# Patient Record
Sex: Female | Born: 1980 | Hispanic: No | Marital: Married | Smoking: Never smoker
Health system: Southern US, Community
[De-identification: ages and names within clinical notes are randomized; demographics above are authoritative.]

## PROBLEM LIST (undated history)

## (undated) DIAGNOSIS — R112 Nausea with vomiting, unspecified: Secondary | ICD-10-CM

## (undated) DIAGNOSIS — Z8619 Personal history of other infectious and parasitic diseases: Secondary | ICD-10-CM

## (undated) DIAGNOSIS — D500 Iron deficiency anemia secondary to blood loss (chronic): Secondary | ICD-10-CM

## (undated) DIAGNOSIS — N920 Excessive and frequent menstruation with regular cycle: Secondary | ICD-10-CM

## (undated) DIAGNOSIS — R42 Dizziness and giddiness: Secondary | ICD-10-CM

---
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
## 2018-09-07 LAB — GLUCOSE, POCT (MANUAL RESULT ENTRY): POC Glucose: 104 mg/dl — AB (ref 70–99)

## 2018-10-31 ENCOUNTER — Ambulatory Visit: Payer: Self-pay | Admitting: Internal Medicine

## 2018-11-01 ENCOUNTER — Ambulatory Visit: Payer: Self-pay | Admitting: Internal Medicine

## 2018-12-06 ENCOUNTER — Ambulatory Visit: Payer: Self-pay | Admitting: Internal Medicine

## 2018-12-19 ENCOUNTER — Ambulatory Visit: Payer: Self-pay | Admitting: Internal Medicine

## 2019-02-05 ENCOUNTER — Ambulatory Visit: Payer: Self-pay | Admitting: Internal Medicine

## 2019-03-16 ENCOUNTER — Ambulatory Visit: Payer: Self-pay | Admitting: Internal Medicine

## 2019-03-20 ENCOUNTER — Other Ambulatory Visit: Payer: Self-pay

## 2019-03-20 ENCOUNTER — Ambulatory Visit: Payer: Self-pay | Admitting: Internal Medicine

## 2019-03-20 ENCOUNTER — Encounter: Payer: Self-pay | Admitting: Internal Medicine

## 2019-03-20 VITALS — BP 112/70 | HR 90 | Resp 12 | Ht 63.0 in | Wt 183.0 lb

## 2019-03-20 DIAGNOSIS — R519 Headache, unspecified: Secondary | ICD-10-CM

## 2019-03-20 DIAGNOSIS — E669 Obesity, unspecified: Secondary | ICD-10-CM

## 2019-03-20 DIAGNOSIS — N920 Excessive and frequent menstruation with regular cycle: Secondary | ICD-10-CM

## 2019-03-20 DIAGNOSIS — K029 Dental caries, unspecified: Secondary | ICD-10-CM | POA: Insufficient documentation

## 2019-03-20 DIAGNOSIS — R42 Dizziness and giddiness: Secondary | ICD-10-CM

## 2019-03-20 DIAGNOSIS — Z6832 Body mass index (BMI) 32.0-32.9, adult: Secondary | ICD-10-CM

## 2019-03-20 DIAGNOSIS — R51 Headache: Secondary | ICD-10-CM

## 2019-03-20 DIAGNOSIS — Z13220 Encounter for screening for lipoid disorders: Secondary | ICD-10-CM

## 2019-03-20 HISTORY — DX: Dizziness and giddiness: R42

## 2019-03-20 HISTORY — DX: Excessive and frequent menstruation with regular cycle: N92.0

## 2019-03-20 HISTORY — DX: Dental caries, unspecified: K02.9

## 2019-03-20 HISTORY — DX: Headache, unspecified: R51.9

## 2019-03-20 MED ORDER — FERROUS GLUCONATE 324 (38 FE) MG PO TABS: 324.0000 mg | ORAL_TABLET | Freq: Two times a day (BID) | ORAL | 3 refills | Status: DC

## 2019-03-20 NOTE — Progress Notes (Signed)
    Subjective:    Patient ID: Stacie Carney, female   DOB: 1981/05/16, 38 y.o.   MRN: [id number]   HPI   Here to establish [facility] interpreting on language line.    1.  Problems patient relates to copper IUD:  Complains of frontal headaches and dizziness as well as heavy periods since IUD placed.   States did not have these symptoms as significantly prior to placement.  Has them all the time now.   Periods are regular, but now 7 days and heavy.  Previously only 3 days and light.  When she is having periods is when she really feels dizzy and with headaches. She is not sure if she wants to have more children or not.   Sounds like her husband feels it's fine for her to just have grandchildren someday. Sounds like she has used injections and BCPs in past.  2.  Dental and gum pain and disease.  Would like a dental exam.  No outpatient medications have been marked as taking for the 03/20/19 encounter (Office Visit) with Mack Hook, MD.   No Known Allergies   Review of Systems    Objective:   BP 112/70 (BP Location: Left Arm, Patient Position: Sitting, Cuff Size: Normal)   Pulse 90   Resp 12   Ht '5\' 3"'$  (1.6 m)   Wt 183 lb (83 kg)   LMP 03/18/2019   BMI 32.42 kg/m   Physical Exam  NAD Obese HEENT: PERRL, EOMI, palpabral conjunctivae quit pale.  TMs pearly gray.  Throat without injection. Missing and broken teeth with gingival recession. Neck:  Supple No adenopathy Chest:  CTA CV:  RRR with normal S1 and S2, No S3, S4 or murmur.  Radial and DP pulses normal and equal Abd:  S NT, No HSM or mass, + BS LE:  No edema. Hyperpigmented marks on dorsal index fingers from sucking those areas as child.  Assessment & Plan   1.  Menorrhagia since IUD placed:  Discussed options, including permanent with Tubal ligation, husband undergoing vasectomy.  BCPs, injection, implant, condoms and being fitted for diaphragm. She and her husband will let me know, though she seems  knowledgeable as to what can be done at [facility].  2.  Dizziness and Headaches:  Suspect secondary to anemia. CBC, CMP. Ferrous gluconate 324 mg twice daily if anemic.  3.  Dental Decay:  Dental referral.  4.  Obesity:  To work on diet and physical activity once we have the menorrhagia and dizziness sorted out.  5.  HM:  FLP  6.  T. Maxey, LCSW in for screening.

## 2019-03-21 LAB — LIPID PANEL W/O CHOL/HDL RATIO
Cholesterol, Total: 169 mg/dL (ref 100–199)
HDL: 41 mg/dL (ref >39)
LDL Calculated: 104 mg/dL — ABNORMAL HIGH (ref 0–99)
Triglycerides: 122 mg/dL (ref 0–149)
VLDL Cholesterol Cal: 24 mg/dL (ref 5–40)

## 2019-03-21 LAB — COMPREHENSIVE METABOLIC PANEL
ALT: 11 IU/L (ref 0–32)
AST: 13 IU/L (ref 0–40)
Albumin/Globulin Ratio: 1.4 (ref 1.2–2.2)
Albumin: 4 g/dL (ref 3.8–4.8)
Alkaline Phosphatase: 67 IU/L (ref 39–117)
BUN/Creatinine Ratio: 15 (ref 9–23)
BUN: 10 mg/dL (ref 6–20)
Bilirubin Total: <0.2 mg/dL (ref 0.0–1.2)
CO2: 21 mmol/L (ref 20–29)
Calcium: 9 mg/dL (ref 8.7–10.2)
Chloride: 105 mmol/L (ref 96–106)
Creatinine, Ser: 0.66 mg/dL (ref 0.57–1.00)
GFR calc Af Amer: 129 mL/min/{1.73_m2} (ref >59)
GFR calc non Af Amer: 112 mL/min/{1.73_m2} (ref >59)
Globulin, Total: 2.8 g/dL (ref 1.5–4.5)
Glucose: 114 mg/dL — ABNORMAL HIGH (ref 65–99)
Potassium: 4.1 mmol/L (ref 3.5–5.2)
Sodium: 141 mmol/L (ref 134–144)
Total Protein: 6.8 g/dL (ref 6.0–8.5)

## 2019-03-21 LAB — CBC WITH DIFFERENTIAL/PLATELET
Basophils Absolute: 0 10*3/uL (ref 0.0–0.2)
Basos: 1 %
EOS (ABSOLUTE): 0.2 10*3/uL (ref 0.0–0.4)
Eos: 3 %
Hematocrit: 33.5 % — ABNORMAL LOW (ref 34.0–46.6)
Hemoglobin: 11 g/dL — ABNORMAL LOW (ref 11.1–15.9)
Immature Grans (Abs): 0 10*3/uL (ref 0.0–0.1)
Immature Granulocytes: 0 %
Lymphocytes Absolute: 1.7 10*3/uL (ref 0.7–3.1)
Lymphs: 36 %
MCH: 27.4 pg (ref 26.6–33.0)
MCHC: 32.8 g/dL (ref 31.5–35.7)
MCV: 83 fL (ref 79–97)
Monocytes Absolute: 0.4 10*3/uL (ref 0.1–0.9)
Monocytes: 8 %
Neutrophils Absolute: 2.5 10*3/uL (ref 1.4–7.0)
Neutrophils: 52 %
Platelets: 277 10*3/uL (ref 150–450)
RBC: 4.02 x10E6/uL (ref 3.77–5.28)
RDW: 13.9 % (ref 11.7–15.4)
WBC: 4.8 10*3/uL (ref 3.4–10.8)

## 2019-03-21 LAB — TSH: TSH: 1.55 u[IU]/mL (ref 0.450–4.500)

## 2019-05-21 ENCOUNTER — Other Ambulatory Visit: Payer: Self-pay

## 2019-05-21 DIAGNOSIS — D649 Anemia, unspecified: Secondary | ICD-10-CM

## 2019-05-22 LAB — CBC WITH DIFFERENTIAL/PLATELET
Basophils Absolute: 0 10*3/uL (ref 0.0–0.2)
Basos: 1 %
EOS (ABSOLUTE): 0.1 10*3/uL (ref 0.0–0.4)
Eos: 3 %
Hematocrit: 36.8 % (ref 34.0–46.6)
Hemoglobin: 12.5 g/dL (ref 11.1–15.9)
Immature Grans (Abs): 0 10*3/uL (ref 0.0–0.1)
Immature Granulocytes: 0 %
Lymphocytes Absolute: 1.3 10*3/uL (ref 0.7–3.1)
Lymphs: 34 %
MCH: 29.6 pg (ref 26.6–33.0)
MCHC: 34 g/dL (ref 31.5–35.7)
MCV: 87 fL (ref 79–97)
Monocytes Absolute: 0.3 10*3/uL (ref 0.1–0.9)
Monocytes: 9 %
Neutrophils Absolute: 2.1 10*3/uL (ref 1.4–7.0)
Neutrophils: 53 %
Platelets: 241 10*3/uL (ref 150–450)
RBC: 4.23 x10E6/uL (ref 3.77–5.28)
RDW: 15.3 % (ref 11.7–15.4)
WBC: 3.8 10*3/uL (ref 3.4–10.8)

## 2019-05-22 LAB — VITAMIN B12: Vitamin B-12: 739 pg/mL (ref 232–1245)

## 2019-05-22 LAB — IRON AND TIBC
Iron Saturation: 25 % (ref 15–55)
Iron: 69 ug/dL (ref 27–159)
Total Iron Binding Capacity: 273 ug/dL (ref 250–450)
UIBC: 204 ug/dL (ref 131–425)

## 2019-05-22 LAB — FOLATE: Folate: 13.7 ng/mL (ref >3.0)

## 2019-05-25 ENCOUNTER — Encounter: Payer: Self-pay | Admitting: Internal Medicine

## 2019-05-25 ENCOUNTER — Ambulatory Visit: Payer: Self-pay | Admitting: Internal Medicine

## 2019-05-25 ENCOUNTER — Other Ambulatory Visit: Payer: Self-pay

## 2019-05-25 VITALS — BP 118/72 | HR 78 | Resp 12 | Ht 63.0 in | Wt 185.0 lb

## 2019-05-25 DIAGNOSIS — R519 Headache, unspecified: Secondary | ICD-10-CM

## 2019-05-25 DIAGNOSIS — R51 Headache: Secondary | ICD-10-CM

## 2019-05-25 DIAGNOSIS — N920 Excessive and frequent menstruation with regular cycle: Secondary | ICD-10-CM

## 2019-05-25 DIAGNOSIS — D500 Iron deficiency anemia secondary to blood loss (chronic): Secondary | ICD-10-CM

## 2019-05-25 DIAGNOSIS — E669 Obesity, unspecified: Secondary | ICD-10-CM

## 2019-05-25 DIAGNOSIS — Z6832 Body mass index (BMI) 32.0-32.9, adult: Secondary | ICD-10-CM

## 2019-05-25 DIAGNOSIS — E785 Hyperlipidemia, unspecified: Secondary | ICD-10-CM | POA: Insufficient documentation

## 2019-05-25 DIAGNOSIS — R42 Dizziness and giddiness: Secondary | ICD-10-CM

## 2019-05-25 HISTORY — DX: Iron deficiency anemia secondary to blood loss (chronic): D50.0

## 2019-05-25 NOTE — Progress Notes (Signed)
    Subjective:    Patient ID: Stacie Carney, female   DOB: 1981/01/14, 38 y.o.   MRN: [id number]   HPI   Interpreted via [facility] interpreter Stacie Carney  1.  Anemia:  Her recent iron, 123456, folic acid studies all in normal range.   CBC recently with anemia resolved with iron supplementation. She was seen last for menorrhagia and associated headaches and dizziness and had mild anemia.  She did pick up the ferrous gluconate and has been taking once to twice daily.   Her dizziness is much better and she is feeling better in general. She is no longer having heavy periods.   She may go to [facility] to have IUD removed when her children are more set with virtual studies. Discussed if she is no longer having the symptoms, may consider leaving the IUD in.    2.  Dental decay:  Has not yet heard from dental clinic.  3.  Low HDL:  Discussed diet high in good fats/oils and daily regular physical activity will bring this up.    4.  HM:  Not sure if gynecological exam with pap done in 2018 or 2019 at [facility] when IUD placed.   She is unable to come in any time soon for a complete physical without pap.         Current Meds  Medication Sig  . ferrous gluconate (FERGON) 324 MG tablet Take 1 tablet (324 mg total) by mouth 2 (two) times daily with a meal.  . paragard intrauterine copper IUD IUD 1 each by Intrauterine route once. Placed in 2018   No Known Allergies   Review of Systems    Objective:   BP 118/72 (BP Location: Left Arm, Patient Position: Sitting, Cuff Size: Normal)   Pulse 78   Resp 12   Ht '5\' 3"'$  (1.6 m)   Wt 185 lb (83.9 kg)   LMP 05/17/2019   BMI 32.77 kg/m   Physical Exam  NAD HEENT:  PERRL, EOMI Neck: Supple, No adenopathy Chest:  CTA CV:  RRR without murmur or rub.  Radial pulses normal and equal Abd:  S, NT, No HSM or mass, + BS LE:  No edema.   Assessment & Plan   1.  Mild anemia:  Resolved with iron supplementation and resolved menorrhagia. Not clear if  she will maintain IUD, but if not, will take care of this through [facility].  2. Dizziness and headaches:  Resolved with resolution of anemia.    3.  Dyslipidemia: discussed healthy diet and regular daily physical activity to bring up HDL.    4.  Obesity:  As in #3.  5.  HM:  Recommend influenza vaccine for 18 and over at our flu clinic Oct 15. She will call when she can work in a CPE without pap.

## 2020-05-26 ENCOUNTER — Telehealth: Payer: Self-pay | Admitting: Internal Medicine

## 2020-05-26 NOTE — Telephone Encounter (Signed)
Patient called requesting appointment to be seen due to be having vomiting and abdominal pain; patient stated has been having these symptoms x a month. Patient stated has been having fever and fatigue with it.  Shared information with Mechele Claude who spoke with patient and provided information for patient to get test it for Covid 19 at [facility] and to call us back after test  and will schedule appointment with Dr. Amil Amen.  Patient verbalized understanding

## 2020-06-03 ENCOUNTER — Telehealth: Payer: Self-pay | Admitting: Internal Medicine

## 2020-06-03 NOTE — Telephone Encounter (Signed)
Pt. Called back today stating is feeling weakness; also stated abdominal pain and vomiting symptoms are gone. Pt. Scheduled for 07/02/2020 and placed on waiting list. Patient did not get tested for covid 19 just yet but stated will do it today and will call back with results.

## 2020-07-02 ENCOUNTER — Encounter: Payer: Self-pay | Admitting: Internal Medicine

## 2020-07-02 ENCOUNTER — Ambulatory Visit: Payer: Self-pay | Admitting: Internal Medicine

## 2020-07-02 VITALS — BP 128/74 | HR 80 | Resp 12 | Ht 62.0 in | Wt 196.0 lb

## 2020-07-02 DIAGNOSIS — R1033 Periumbilical pain: Secondary | ICD-10-CM

## 2020-07-02 DIAGNOSIS — N920 Excessive and frequent menstruation with regular cycle: Secondary | ICD-10-CM

## 2020-07-02 NOTE — Progress Notes (Signed)
    Subjective:    Patient ID: Stacie Carney, female   DOB: 20-Mar-1981, 39 y.o.   MRN: [id number]   HPI   [facility], interprets.  Arabic.  Has not been seen in past year.  Past 2 months with periumbilical discomfort.  Most recently has episodes twice weekly.  Sometimes awakens with nausea and vomiting.  Describes feeling bloated and gassy.  Most of time, occurs when going to sleep and awakens with the pain and nausea.   If vomits, feels better.   No significant relief with belching or passing gas--just a little improvement.   She has had constipation as well.  Not clear this is frequent--perhaps only in past 2-3 days.   May have had diarrhea a couple of times over past 2 months, but not associated with the pain. More so, on nights with pain, she has difficulty passing stool. No melena or hematochezia. Has noted fatty meals the evening before increase the risk of her having the pain that night.  Sodas and acidic juices seem to make her not feel well also. May feel warm with the pain, but has never taken her temp.    States her mother had similar symptoms and ultimately diagnosed with IBS.  No one else at home with similar symptoms.  She has not travelled anywhere in past 6 months.  No significant weight loss with these symptoms.    She does state she has been quite stressed as her father has been ill for past few months and died 3 days ago in [place].  She does not believe she requires grief counseling.    History of menorrhogia with mild anemia.  The heavy periods seemed to start with IUD.  She does continue to have heavy periods.  She is sexually active and does not have discomfort.  She has a copper IUD.  Placed 4 years ago.    Not vaccinated with COVID--discussed    Current Meds  Medication Sig  . paragard intrauterine copper IUD IUD 1 each by Intrauterine route once. Placed in 2018   No Known Allergies   Review of Systems    Objective:   BP 128/74 (BP Location: Left  Arm, Patient Position: Sitting, Cuff Size: Large)   Pulse 80   Resp 12   Ht 5\' 2"  (1.575 m)   Wt 196 lb (88.9 kg)   LMP 06/03/2020 (Approximate)   BMI 35.85 kg/m   Physical Exam  NAD Lungs:  CTA CV:  RRR without murmur or rub.  Radial pulses normal and equal Abd:  S, possibly mild tenderness in LLQ, but seems to dissipate with repeated palpation.  No HSM or mass, + BS LE:  No edema   Assessment & Plan   Episodic periumbilical pain:  CBC, CMP.  Avoid fatty diet.  To start low dose fiber laxative with water and gradually titrate to normal daily dosing and for symptoms of constipation.   To call if abdominal pain continues to recur--would like to see her when she is having it. Consider abdominal ultrasound then  COVID vaccination:  Discussed at length and encouraged her to go to [email or web address].gov for info as well as obtaining the vaccine.  Menorrhagia:  CBC.  Encouraged taking iron supplement.

## 2020-07-02 NOTE — Patient Instructions (Signed)
Metamucil or Citrucel ion 8 oz water daily--start with half daily dose or less and gradually increase to where you are not having pain or constipation.

## 2020-07-03 LAB — CBC WITH DIFFERENTIAL/PLATELET
Basophils Absolute: 0 10*3/uL (ref 0.0–0.2)
Basos: 0 %
EOS (ABSOLUTE): 0.1 10*3/uL (ref 0.0–0.4)
Eos: 1 %
Hematocrit: 39.2 % (ref 34.0–46.6)
Hemoglobin: 13.2 g/dL (ref 11.1–15.9)
Immature Grans (Abs): 0 10*3/uL (ref 0.0–0.1)
Immature Granulocytes: 0 %
Lymphocytes Absolute: 1.8 10*3/uL (ref 0.7–3.1)
Lymphs: 41 %
MCH: 29.3 pg (ref 26.6–33.0)
MCHC: 33.7 g/dL (ref 31.5–35.7)
MCV: 87 fL (ref 79–97)
Monocytes Absolute: 0.4 10*3/uL (ref 0.1–0.9)
Monocytes: 9 %
Neutrophils Absolute: 2.2 10*3/uL (ref 1.4–7.0)
Neutrophils: 49 %
Platelets: 260 10*3/uL (ref 150–450)
RBC: 4.51 x10E6/uL (ref 3.77–5.28)
RDW: 12.7 % (ref 11.7–15.4)
WBC: 4.5 10*3/uL (ref 3.4–10.8)

## 2020-07-03 LAB — COMPREHENSIVE METABOLIC PANEL
ALT: 10 IU/L (ref 0–32)
AST: 13 IU/L (ref 0–40)
Albumin/Globulin Ratio: 1.3 (ref 1.2–2.2)
Albumin: 4.3 g/dL (ref 3.8–4.8)
Alkaline Phosphatase: 71 IU/L (ref 44–121)
BUN/Creatinine Ratio: 14 (ref 9–23)
BUN: 8 mg/dL (ref 6–24)
Bilirubin Total: <0.2 mg/dL (ref 0.0–1.2)
CO2: 23 mmol/L (ref 20–29)
Calcium: 9.2 mg/dL (ref 8.7–10.2)
Chloride: 101 mmol/L (ref 96–106)
Creatinine, Ser: 0.59 mg/dL (ref 0.57–1.00)
GFR calc Af Amer: 133 mL/min/{1.73_m2} (ref >59)
GFR calc non Af Amer: 115 mL/min/{1.73_m2} (ref >59)
Globulin, Total: 3.4 g/dL (ref 1.5–4.5)
Glucose: 83 mg/dL (ref 65–99)
Potassium: 4.7 mmol/L (ref 3.5–5.2)
Sodium: 137 mmol/L (ref 134–144)
Total Protein: 7.7 g/dL (ref 6.0–8.5)

## 2020-09-02 ENCOUNTER — Ambulatory Visit: Payer: Self-pay | Admitting: Internal Medicine

## 2020-09-05 ENCOUNTER — Ambulatory Visit: Payer: Self-pay | Admitting: Internal Medicine

## 2021-01-08 ENCOUNTER — Ambulatory Visit: Payer: Self-pay | Admitting: Internal Medicine

## 2021-02-26 ENCOUNTER — Ambulatory Visit (INDEPENDENT_AMBULATORY_CARE_PROVIDER_SITE_OTHER): Payer: BC Managed Care – PPO

## 2021-02-26 ENCOUNTER — Encounter (HOSPITAL_COMMUNITY): Payer: Self-pay

## 2021-02-26 ENCOUNTER — Ambulatory Visit (HOSPITAL_COMMUNITY)
Admission: EM | Admit: 2021-02-26 | Discharge: 2021-02-26 | Disposition: A | Discharge status: Home / Self Care | Payer: BC Managed Care – PPO | Attending: Internal Medicine | Admitting: Internal Medicine

## 2021-02-26 ENCOUNTER — Other Ambulatory Visit: Payer: Self-pay

## 2021-02-26 DIAGNOSIS — R1030 Lower abdominal pain, unspecified: Secondary | ICD-10-CM

## 2021-02-26 DIAGNOSIS — R1084 Generalized abdominal pain: Secondary | ICD-10-CM | POA: Diagnosis not present

## 2021-02-26 LAB — POCT URINALYSIS DIPSTICK, ED / UC
Bilirubin Urine: NEGATIVE
Glucose, UA: NEGATIVE mg/dL
Ketones, ur: NEGATIVE mg/dL
Leukocytes,Ua: NEGATIVE
Nitrite: NEGATIVE
Protein, ur: NEGATIVE mg/dL
Specific Gravity, Urine: 1.015 (ref 1.005–1.030)
Urobilinogen, UA: 0.2 mg/dL (ref 0.0–1.0)
pH: 6.5 (ref 5.0–8.0)

## 2021-02-26 LAB — POC URINE PREG, ED: Preg Test, Ur: NEGATIVE

## 2021-02-26 NOTE — ED Triage Notes (Signed)
Pt presents with pain in the lower abdomen X 3 months. Pt c/o the pain starting at night.   States she has swelling around her belly button.

## 2021-02-26 NOTE — ED Provider Notes (Signed)
[place]    CSN: [id number] Arrival date & time: 02/26/21  1131      History   Chief Complaint Chief Complaint  Patient presents with   Abdominal Pain    HPI Stacie Carney is a 40 y.o. female with past medical history of obesity, dyslipidemia, iron deficiency anemia presents urgent care today with complaints of abdominal pain.  Patient states symptoms ongoing for approximately 5 months but seem to be increasing in frequency occurring approximately every 2 days.  Chart reviewed patient seen outpatient in 11/21 for same symptoms.  CBC and c-Met at that time were unremarkable.  She never did try Metamucil as recommended as she states she misplaced discharge instructions.  Patient unable to relate pain or discomfort to eating and states symptoms seem to be worse at night.  She has been off her iron therapy for 5 months as she often takes herself off intermittently.  Discomfort often associated with nausea.  She denies any diarrhea and states she frequently has issues with constipation.  She denies any melena, hematochezia, unintentional weight loss, dysuria, hematuria, irregular vaginal bleeding or discharge, no recent fever or chills.   Past Medical History:  Diagnosis Date   Dizziness 03/20/2019   Iron deficiency anemia due to chronic blood loss 05/25/2019   Menorrhagia with regular cycle 03/20/2019   Nonintractable headache 03/20/2019    Patient Active Problem List   Diagnosis Date Noted   Dyslipidemia 05/25/2019   Dental decay 03/20/2019   Class 1 obesity without serious comorbidity with body mass index (BMI) of 32.0 to 32.9 in adult 03/20/2019    Past Surgical History:  Procedure Laterality Date   CESAREAN SECTION  2009, 2012, 2014, 2017    OB History   No obstetric history on file.      Home Medications    Prior to Admission medications   Medication Sig Start Date End Date Taking? Authorizing Provider  ferrous gluconate (FERGON) 324 MG tablet Take 1  tablet (324 mg total) by mouth 2 (two) times daily with a meal. Patient not taking: Reported on 07/02/2020 03/20/19   Mack Hook, MD  paragard intrauterine copper IUD IUD 1 each by Intrauterine route once. Placed in 2018    [provider]    Family History Family History  Problem Relation Age of Onset   Hypertension Mother     Social History Social History   Tobacco Use   Smoking status: Never   Smokeless tobacco: Never  Vaping Use   Vaping Use: Never used  Substance Use Topics   Alcohol use: Never   Drug use: Never     Allergies   Patient has no known allergies.   Review of Systems As stated in HPI otherwise negative   Physical Exam Triage Vital Signs ED Triage Vitals  Enc Vitals Group     BP 02/26/21 1158 132/84     Pulse Rate 02/26/21 1158 89     Resp 02/26/21 1158 16     Temp 02/26/21 1158 98.2 F (36.8 C)     Temp Source 02/26/21 1158 Oral     SpO2 02/26/21 1158 98 %     Weight --      Height --      Head Circumference --      Peak Flow --      Pain Score 02/26/21 1200 9     Pain Loc --      Pain Edu? --      Excl. in  GC? --    No data found.  Updated Vital Signs BP 132/84 (BP Location: Right Arm)   Pulse 89   Temp 98.2 F (36.8 C) (Oral)   Resp 16   LMP 02/09/2021 (Exact Date)   SpO2 98%   Visual Acuity Right Eye Distance:   Left Eye Distance:   Bilateral Distance:    Right Eye Near:   Left Eye Near:    Bilateral Near:     Physical Exam Constitutional:      Appearance: She is well-developed. She is obese.  HENT:     Mouth/Throat:     Mouth: Mucous membranes are moist.  Eyes:     Extraocular Movements: Extraocular movements intact.  Cardiovascular:     Rate and Rhythm: Normal rate and regular rhythm.     Heart sounds: No murmur heard.   No friction rub. No gallop.  Pulmonary:     Effort: Pulmonary effort is normal.     Breath sounds: Normal breath sounds. No wheezing, rhonchi or rales.  Abdominal:      General: Bowel sounds are decreased.     Palpations: Abdomen is soft.     Tenderness: There is generalized abdominal tenderness. There is no right CVA tenderness, left CVA tenderness, guarding or rebound. Negative signs include Murphy's sign and McBurney's sign.     Hernia: No hernia is present.     Comments: Mild distention but soft  Skin:    General: Skin is warm and dry.  Neurological:     General: No focal deficit present.     Mental Status: She is alert.  Psychiatric:        Mood and Affect: Mood normal.        Behavior: Behavior normal.     UC Treatments / Results  Labs (all labs ordered are listed, but only abnormal results are displayed) Labs Reviewed  POCT URINALYSIS DIPSTICK, ED / UC - Abnormal; Notable for the following components:      Result Value   Hgb urine dipstick TRACE (*)    All other components within normal limits  POC URINE PREG, ED    EKG   Radiology DG Abd 2 Views  Result Date: 02/26/2021 CLINICAL DATA:  Lower abdominal pain and swelling about the umbilicus for 3 months. EXAM: ABDOMEN - 2 VIEW COMPARISON:  None. FINDINGS: The bowel gas pattern is normal. There is no evidence of free air. No radio-opaque calculi or other significant radiographic abnormality is seen. IUD noted. IMPRESSION: Negative exam. Electronically Signed   By: Inge Rise M.D.   On: 02/26/2021 13:17    Procedures Procedures (including critical care time)  Medications Ordered in UC Medications - No data to display  Initial Impression / Assessment and Plan / UC Course  I have reviewed the triage vital signs and the nursing notes.  Pertinent labs & imaging results that were available during my care of the patient were reviewed by me and considered in my medical decision making (see chart for details).  Abdominal pain -Ongoing for several months.  Seen by PCP in 11/21 for similar symptoms.  Has not tried Metamucil as recommended as she misplaced discharge  instructions. -Abdomen is nonacute, VSS.  Abdominal x-ray unremarkable.  States she does struggle with constipation -Unlikely related to iron as she has not been taking x3 months and continues to have discomfort -Consider constipation vs IBS as she does have family history of this.  Low suspicion for infectious etiology based on exam.  No evidence of gallbladder involvement -Patient would likely benefit from abdominal ultrasound and/or GI referral.  Explained via interpreter all information to patient.  She states current PCP location is not convenient for her and needs referral to different practice.  I have referred her to community health and wellness.  Perhaps she could benefit from their mobile services.  In the meantime, I have asked her to try the Metamucil and increase dietary fiber to determine if this helps relieve symptoms -Instructed she will need to go to the emergency department should she develop any fever, chills or worsening abdominal pain  Reviewed expections re: course of current medical issues. Questions answered. Outlined signs and symptoms indicating need for more acute intervention. Pt verbalized understanding. AVS given   Final Clinical Impressions(s) / UC Diagnoses   Final diagnoses:  Generalized abdominal pain     Discharge Instructions      Your x-ray today looks okay.  I am not sure why you are having abdominal pain.  I would like you to try Metamucil daily as needed per our discussion and your discussion previously with Dr. Amil Amen.  I believe you need further work-up that we are not able to provide at this practice.  Please call above number to try to establish care.  You will need to go to the ER should you develop any fever chills or worsening pain.     ED Prescriptions   None    PDMP not reviewed this encounter.   Rudolpho Sevin, NP 02/26/21 1426

## 2021-02-26 NOTE — Discharge Instructions (Addendum)
Your x-ray today looks okay.  I am not sure why you are having abdominal pain.  I would like you to try Metamucil daily as needed per our discussion and your discussion previously with Dr. Delrae Alfred.  I believe you need further work-up that we are not able to provide at this practice.  Please call above number to try to establish care.  You will need to go to the ER should you develop any fever chills or worsening pain.

## 2021-04-18 ENCOUNTER — Encounter: Payer: Self-pay | Admitting: Radiology

## 2021-07-08 ENCOUNTER — Other Ambulatory Visit: Payer: Self-pay

## 2021-07-08 ENCOUNTER — Encounter: Payer: BC Managed Care – PPO | Admitting: Obstetrics and Gynecology

## 2021-07-08 ENCOUNTER — Ambulatory Visit (INDEPENDENT_AMBULATORY_CARE_PROVIDER_SITE_OTHER): Payer: BC Managed Care – PPO | Admitting: Obstetrics & Gynecology

## 2021-07-08 ENCOUNTER — Encounter: Payer: Self-pay | Admitting: Obstetrics & Gynecology

## 2021-07-08 VITALS — BP 95/81 | HR 87 | Ht 62.5 in | Wt 200.9 lb

## 2021-07-08 DIAGNOSIS — Z30432 Encounter for removal of intrauterine contraceptive device: Secondary | ICD-10-CM

## 2021-07-08 NOTE — Progress Notes (Signed)
GYNECOLOGY OFFICE VISIT NOTE  History:   Stacie Carney is a 40 y.o. [phone] here today as a referral from [facility] to be set up for IUD removal under sedation. Patient is Arabic-speaking only, interpreter present for this encounter.  Has Paragard in place, wants this removed and wants to be on OCPs vs Depo Provera. She has extreme anxiety and fear about pelvic examinations, had all her children via cesarean sections.  Today, she wants [place] to try to remove the IUD in office.  She denies any current abnormal vaginal discharge, bleeding, pelvic pain or other concerns.    Past Medical History:  Diagnosis Date   Dizziness 03/20/2019   Iron deficiency anemia due to chronic blood loss 05/25/2019   Menorrhagia with regular cycle 03/20/2019   Nonintractable headache 03/20/2019    Past Surgical History:  Procedure Laterality Date   CESAREAN SECTION  2009, 2012, 2014, 2017    The following portions of the patient's history were reviewed and updated as appropriate: allergies, current medications, past family history, past medical history, past social history, past surgical history and problem list.   Health Maintenance:  Does not remember when she had pap smear last. Adamantly declines mammography.  Review of Systems:  Pertinent items noted in HPI and remainder of comprehensive ROS otherwise negative.  Physical Exam:  BP 95/81   Pulse 87   Ht 5' 2.5" (1.588 m)   Wt 200 lb 14.4 oz (91.1 kg)   LMP 06/21/2021 (Exact Date) Comment: Heavy periods on IUD- wants it removed  BMI 36.16 kg/m  CONSTITUTIONAL: Well-developed, well-nourished female in no acute distress.  HEENT:  Normocephalic, atraumatic. External right and left ear normal. No scleral icterus.  NECK: Normal range of motion, supple, no masses noted on observation SKIN: No rash noted. Not diaphoretic. No erythema. No pallor. MUSCULOSKELETAL: Normal range of motion. No edema noted. NEUROLOGIC: Alert and oriented to person, place, and time.  Normal muscle tone coordination. No cranial nerve deficit noted. PSYCHIATRIC: Anxious mood and affect. Normal behavior. Normal judgment and thought content. CARDIOVASCULAR: Normal heart rate noted RESPIRATORY: Effort and breath sounds normal, no problems with respiration noted ABDOMEN: No masses noted. No other overt distention noted.   PELVIC: Normal appearing external genitalia. Unable to proceed with examination past visualization, she repeatedly asked me to stop when I tried touched her labia in trying to separate them for speculum placement.  She asked me to try again multiple times, with same response.  I declined to try again after third attempt given her extreme anxiety and aversion to the exam. Performed in the presence of a chaperone      Assessment and Plan:     1. Awaiting removal of contraceptive intrauterine device (IUD) This will be done in OR under sedation.  Procedure: Exam under anesthesia, pap smear, Paragard IUD removal Indications: Extreme anxiety about pelvic examinations in office  All questions were answered.  She was told that she will be contacted by our surgical scheduler regarding the time and date of her procedure; routine preoperative instructions will be given to her by the preoperative nursing team.     Routine preventative health maintenance measures emphasized. Please refer to After Visit Summary for other counseling recommendations.   Return for any gynecologic concerns.    I spent 20 minutes dedicated to the care of this patient including pre-visit review of records, face to face time with the patient discussing her conditions and treatments and post visit orders.    Palmer Fahrner  Harolyn Rutherford, MD, [place], [facility] for [employer], [place]

## 2021-07-08 NOTE — H&P (View-Only) (Signed)
GYNECOLOGY OFFICE VISIT NOTE  History:   Stacie Carney is a 40 y.o. [phone] here today as a referral from [facility] to be set up for IUD removal under sedation. Patient is Arabic-speaking only, interpreter present for this encounter.  Has Paragard in place, wants this removed and wants to be on OCPs vs Depo Provera. She has extreme anxiety and fear about pelvic examinations, had all her children via cesarean sections.  Today, she wants [place] to try to remove the IUD in office.  She denies any current abnormal vaginal discharge, bleeding, pelvic pain or other concerns.    Past Medical History:  Diagnosis Date   Dizziness 03/20/2019   Iron deficiency anemia due to chronic blood loss 05/25/2019   Menorrhagia with regular cycle 03/20/2019   Nonintractable headache 03/20/2019    Past Surgical History:  Procedure Laterality Date   CESAREAN SECTION  2009, 2012, 2014, 2017    The following portions of the patient's history were reviewed and updated as appropriate: allergies, current medications, past family history, past medical history, past social history, past surgical history and problem list.   Health Maintenance:  Does not remember when she had pap smear last. Adamantly declines mammography.  Review of Systems:  Pertinent items noted in HPI and remainder of comprehensive ROS otherwise negative.  Physical Exam:  BP 95/81   Pulse 87   Ht 5' 2.5" (1.588 m)   Wt 200 lb 14.4 oz (91.1 kg)   LMP 06/21/2021 (Exact Date) Comment: Heavy periods on IUD- wants it removed  BMI 36.16 kg/m  CONSTITUTIONAL: Well-developed, well-nourished female in no acute distress.  HEENT:  Normocephalic, atraumatic. External right and left ear normal. No scleral icterus.  NECK: Normal range of motion, supple, no masses noted on observation SKIN: No rash noted. Not diaphoretic. No erythema. No pallor. MUSCULOSKELETAL: Normal range of motion. No edema noted. NEUROLOGIC: Alert and oriented to person, place, and time.  Normal muscle tone coordination. No cranial nerve deficit noted. PSYCHIATRIC: Anxious mood and affect. Normal behavior. Normal judgment and thought content. CARDIOVASCULAR: Normal heart rate noted RESPIRATORY: Effort and breath sounds normal, no problems with respiration noted ABDOMEN: No masses noted. No other overt distention noted.   PELVIC: Normal appearing external genitalia. Unable to proceed with examination past visualization, she repeatedly asked me to stop when I tried touched her labia in trying to separate them for speculum placement.  She asked me to try again multiple times, with same response.  I declined to try again after third attempt given her extreme anxiety and aversion to the exam. Performed in the presence of a chaperone      Assessment and Plan:     1. Awaiting removal of contraceptive intrauterine device (IUD) This will be done in OR under sedation.  Procedure: Exam under anesthesia, pap smear, Paragard IUD removal Indications: Extreme anxiety about pelvic examinations in office  All questions were answered.  She was told that she will be contacted by our surgical scheduler regarding the time and date of her procedure; routine preoperative instructions will be given to her by the preoperative nursing team.     Routine preventative health maintenance measures emphasized. Please refer to After Visit Summary for other counseling recommendations.   Return for any gynecologic concerns.    I spent 20 minutes dedicated to the care of this patient including pre-visit review of records, face to face time with the patient discussing her conditions and treatments and post visit orders.    Arbor Cohen  Harolyn Rutherford, MD, [place], [facility] for [employer], [place]

## 2021-07-22 ENCOUNTER — Telehealth: Payer: Self-pay

## 2021-07-22 NOTE — Telephone Encounter (Signed)
Called patient with pacific interpreters [place] [phone], to notify patient of surgery date and time, no answer, unable to leave voicemail, voice mailbox not set up. Will call again later.

## 2021-07-27 ENCOUNTER — Other Ambulatory Visit: Payer: Self-pay

## 2021-07-27 ENCOUNTER — Encounter (HOSPITAL_BASED_OUTPATIENT_CLINIC_OR_DEPARTMENT_OTHER): Payer: Self-pay | Admitting: Obstetrics & Gynecology

## 2021-07-27 ENCOUNTER — Telehealth: Payer: Self-pay

## 2021-07-27 NOTE — Progress Notes (Signed)
Spoke w/ via phone for pre-op interview---pt's husband Stacie Carney---- urine POCT              Lab results------none COVID test -----patient states asymptomatic no test needed Arrive at -------0900 NPO after MN NO Solid Food.  Clear liquids from MN until---0800 Med rec completed Medications to take morning of surgery -----none Diabetic medication -----n/a Patient instructed no nail polish to be worn day of surgery Patient instructed to bring photo id and insurance card day of surgery Patient aware to have Driver (ride ) / caregiver    for 24 hours after surgery - husband Stacie Carney Patient Special Instructions -----none Pre-Op special Istructions ----- Patient verbalized understanding of instructions that were given at this phone interview. Patient denies shortness of breath, chest pain, fever, cough at this phone interview.   Arabic interpreter requested through Interpreting @ [place] on 07/27/21. Patient's husband requested a female interpreter. Patient's husband does speak [place], but still requested an interpreter for his wife.

## 2021-07-27 NOTE — Telephone Encounter (Signed)
Called pt phone number 3 times w/ [employer] ID [phone], someone would pick up the phone and then hang up  Called pt spouse phone number, surgery date/time and preop instructions given to husband. Pt's husband requested the information be emailed to him at [email or web address].  Email sent securely

## 2021-07-28 DIAGNOSIS — F419 Anxiety disorder, unspecified: Secondary | ICD-10-CM | POA: Diagnosis present

## 2021-07-28 DIAGNOSIS — Z30432 Encounter for removal of intrauterine contraceptive device: Secondary | ICD-10-CM

## 2021-07-28 DIAGNOSIS — Z01419 Encounter for gynecological examination (general) (routine) without abnormal findings: Secondary | ICD-10-CM

## 2021-07-29 ENCOUNTER — Encounter (HOSPITAL_BASED_OUTPATIENT_CLINIC_OR_DEPARTMENT_OTHER)
Admission: RE | Disposition: A | Discharge status: Home / Self Care | Payer: Self-pay | Source: Home / Self Care | Attending: Obstetrics & Gynecology

## 2021-07-29 ENCOUNTER — Ambulatory Visit (HOSPITAL_BASED_OUTPATIENT_CLINIC_OR_DEPARTMENT_OTHER): Payer: BC Managed Care – PPO | Admitting: Certified Registered"

## 2021-07-29 ENCOUNTER — Ambulatory Visit (HOSPITAL_BASED_OUTPATIENT_CLINIC_OR_DEPARTMENT_OTHER)
Admission: RE | Admit: 2021-07-29 | Discharge: 2021-07-29 | Disposition: A | Discharge status: Home / Self Care | Payer: BC Managed Care – PPO | Attending: Obstetrics & Gynecology | Admitting: Obstetrics & Gynecology

## 2021-07-29 ENCOUNTER — Encounter (HOSPITAL_BASED_OUTPATIENT_CLINIC_OR_DEPARTMENT_OTHER): Payer: Self-pay | Admitting: Obstetrics & Gynecology

## 2021-07-29 ENCOUNTER — Other Ambulatory Visit: Payer: Self-pay

## 2021-07-29 DIAGNOSIS — Z30432 Encounter for removal of intrauterine contraceptive device: Secondary | ICD-10-CM | POA: Diagnosis present

## 2021-07-29 DIAGNOSIS — F419 Anxiety disorder, unspecified: Secondary | ICD-10-CM | POA: Diagnosis present

## 2021-07-29 DIAGNOSIS — Z01419 Encounter for gynecological examination (general) (routine) without abnormal findings: Secondary | ICD-10-CM

## 2021-07-29 HISTORY — PX: IUD REMOVAL: SHX5392

## 2021-07-29 LAB — POCT PREGNANCY, URINE: Preg Test, Ur: NEGATIVE

## 2021-07-29 SURGERY — EXAM UNDER ANESTHESIA
Anesthesia: Monitor Anesthesia Care | Site: Vagina

## 2021-07-29 MED ORDER — KETOROLAC TROMETHAMINE 30 MG/ML IJ SOLN
INTRAMUSCULAR | Status: DC | PRN
  Administered 2021-07-29: 30 mg via INTRAVENOUS

## 2021-07-29 MED ORDER — MIDAZOLAM HCL 5 MG/5ML IJ SOLN
INTRAMUSCULAR | Status: DC | PRN
  Administered 2021-07-29 (×2): 1 mg via INTRAVENOUS
  Administered 2021-07-29: 2 mg via INTRAVENOUS

## 2021-07-29 MED ORDER — ONDANSETRON HCL 4 MG/2ML IJ SOLN
INTRAMUSCULAR | Status: AC
  Filled 2021-07-29: qty 2

## 2021-07-29 MED ORDER — AMISULPRIDE (ANTIEMETIC) 5 MG/2ML IV SOLN: 10.0000 mg | Freq: Once | INTRAVENOUS | Status: DC | PRN

## 2021-07-29 MED ORDER — POVIDONE-IODINE 10 % EX SWAB: 2.0000 "application " | Freq: Once | CUTANEOUS | Status: DC

## 2021-07-29 MED ORDER — LIDOCAINE HCL (CARDIAC) PF 100 MG/5ML IV SOSY
PREFILLED_SYRINGE | INTRAVENOUS | Status: DC | PRN
  Administered 2021-07-29: 60 mg via INTRAVENOUS

## 2021-07-29 MED ORDER — MIDAZOLAM HCL 2 MG/2ML IJ SOLN
INTRAMUSCULAR | Status: AC
  Filled 2021-07-29: qty 2

## 2021-07-29 MED ORDER — ACETAMINOPHEN 500 MG PO TABS
ORAL_TABLET | ORAL | Status: AC
  Filled 2021-07-29: qty 2

## 2021-07-29 MED ORDER — GABAPENTIN 300 MG PO CAPS
ORAL_CAPSULE | ORAL | Status: AC
  Filled 2021-07-29: qty 1

## 2021-07-29 MED ORDER — KETOROLAC TROMETHAMINE 30 MG/ML IJ SOLN
INTRAMUSCULAR | Status: AC
  Filled 2021-07-29: qty 1

## 2021-07-29 MED ORDER — PROMETHAZINE HCL 25 MG/ML IJ SOLN
6.2500 mg | INTRAMUSCULAR | Status: DC | PRN
Start: 2021-07-29 — End: 2021-07-29

## 2021-07-29 MED ORDER — OXYCODONE HCL 5 MG/5ML PO SOLN: 5.0000 mg | Freq: Once | ORAL | Status: DC | PRN

## 2021-07-29 MED ORDER — SILVER NITRATE-POT NITRATE 75-25 % EX MISC
CUTANEOUS | Status: DC | PRN
  Administered 2021-07-29: 1

## 2021-07-29 MED ORDER — ACETAMINOPHEN 500 MG PO TABS
1000.0000 mg | ORAL_TABLET | ORAL | Status: AC
  Administered 2021-07-29: 1000 mg via ORAL

## 2021-07-29 MED ORDER — DEXAMETHASONE SODIUM PHOSPHATE 4 MG/ML IJ SOLN
INTRAMUSCULAR | Status: DC | PRN
  Administered 2021-07-29: 5 mg via INTRAVENOUS

## 2021-07-29 MED ORDER — FENTANYL CITRATE (PF) 100 MCG/2ML IJ SOLN: 25.0000 ug | INTRAMUSCULAR | Status: DC | PRN

## 2021-07-29 MED ORDER — LACTATED RINGERS IV SOLN: INTRAVENOUS | Status: DC

## 2021-07-29 MED ORDER — KETOROLAC TROMETHAMINE 30 MG/ML IJ SOLN: 30.0000 mg | Freq: Once | INTRAMUSCULAR | Status: DC | PRN

## 2021-07-29 MED ORDER — FENTANYL CITRATE (PF) 100 MCG/2ML IJ SOLN
INTRAMUSCULAR | Status: DC | PRN
  Administered 2021-07-29 (×2): 12.5 ug via INTRAVENOUS
  Administered 2021-07-29: 25 ug via INTRAVENOUS
  Administered 2021-07-29: 12.5 ug via INTRAVENOUS
  Administered 2021-07-29: 25 ug via INTRAVENOUS
  Administered 2021-07-29: 12.5 ug via INTRAVENOUS

## 2021-07-29 MED ORDER — PROPOFOL 10 MG/ML IV BOLUS
INTRAVENOUS | Status: AC
  Filled 2021-07-29: qty 20

## 2021-07-29 MED ORDER — FENTANYL CITRATE (PF) 100 MCG/2ML IJ SOLN
INTRAMUSCULAR | Status: AC
  Filled 2021-07-29: qty 2

## 2021-07-29 MED ORDER — DEXAMETHASONE SODIUM PHOSPHATE 10 MG/ML IJ SOLN
INTRAMUSCULAR | Status: AC
  Filled 2021-07-29: qty 1

## 2021-07-29 MED ORDER — ONDANSETRON HCL 4 MG/2ML IJ SOLN
INTRAMUSCULAR | Status: DC | PRN
  Administered 2021-07-29: 4 mg via INTRAVENOUS

## 2021-07-29 MED ORDER — GABAPENTIN 300 MG PO CAPS
300.0000 mg | ORAL_CAPSULE | ORAL | Status: AC
  Administered 2021-07-29: 300 mg via ORAL

## 2021-07-29 MED ORDER — OXYCODONE HCL 5 MG PO TABS: 5.0000 mg | ORAL_TABLET | Freq: Once | ORAL | Status: DC | PRN

## 2021-07-29 MED ORDER — LIDOCAINE 2% (20 MG/ML) 5 ML SYRINGE
INTRAMUSCULAR | Status: AC
  Filled 2021-07-29: qty 5

## 2021-07-29 MED ORDER — PROPOFOL 10 MG/ML IV BOLUS
INTRAVENOUS | Status: DC | PRN
  Administered 2021-07-29 (×2): 30 mg via INTRAVENOUS

## 2021-07-29 SURGICAL SUPPLY — 10 items
CATH ROBINSON RED A/P 16FR (CATHETERS) ×2 IMPLANT
GAUZE 4X4 16PLY ~~LOC~~+RFID DBL (SPONGE) IMPLANT
GLOVE SURG LTX SZ7 (GLOVE) ×2 IMPLANT
GLOVE SURG UNDER POLY LF SZ7 (GLOVE) ×2 IMPLANT
GLOVE SURG UNDER POLY LF SZ7.5 (GLOVE) ×2 IMPLANT
GOWN STRL REUS W/TWL LRG LVL3 (GOWN DISPOSABLE) IMPLANT
KIT TURNOVER CYSTO (KITS) ×2 IMPLANT
PACK VAGINAL MINOR WOMEN LF (CUSTOM PROCEDURE TRAY) IMPLANT
PAD OB MATERNITY 4.3X12.25 (PERSONAL CARE ITEMS) ×2 IMPLANT
UNDERPAD 30X36 HEAVY ABSORB (UNDERPADS AND DIAPERS) ×2 IMPLANT

## 2021-07-29 NOTE — Transfer of Care (Signed)
Immediate Anesthesia Transfer of Care Note  Patient: Stacie Carney  Procedure(s) Performed: Procedure(s) (LRB): EXAM UNDER ANESTHESIA (N/A) INTRAUTERINE DEVICE (IUD) REMOVAL AND PAP SMEAR (N/A)  Patient Location: PACU  Anesthesia Type: MAC  Level of Consciousness: awake, sedated, patient cooperative and responds to stimulation  Airway & Oxygen Therapy: Patient Spontanous Breathing and Patient connected to [place] oxygen soft FM   Post-op Assessment: Report given to PACU RN, Post -op Vital signs reviewed and stable and Patient moving all extremities  Post vital signs: Reviewed and stable  Complications: No apparent anesthesia complications

## 2021-07-29 NOTE — Op Note (Signed)
PREOPERATIVE DIAGNOSES: Extreme anxiety about pelvic examinations in office, desire for IUD removal POSTOPERATIVE DIAGNOSES: The same PROCEDURE: EXAM UNDER ANESTHESIA, PAP SMEAR, INTRAUTERINE DEVICE REMOVAL SURGEON:  Dr. Verita Schneiders ANESTHESIOLOGY TEAM:  Anesthesiologist: Murvin Natal, MD CRNA: Justice Rocher, CRNA  INDICATIONS: 40 y.o. [phone] here for pap smear and IUD removal under anesthesia secondary to extreme anxiety about pelvic exams in office. The risks of surgery were discussed in detail with the patient including but not limited to: bleeding; infection which may require antibiotics; injury to uterus or surrounding organs; need for additional procedures including laparoscopy or laparotomy; thromboembolic phenomenon, surgical site problems and other postoperative/anesthesia complications. Written informed consent was obtained.    FINDINGS:  Small uterus, normal cervix and IUD strings visible. Pap smear done.  Paragard IUD removed easily   ANESTHESIA:   MAC ESTIMATED BLOOD LOSS: 1 ml COMPLICATIONS: None immediate  PROCEDURE IN DETAIL: The patient  was then taken to the operating room where MAC was administered and was found to be adequate.  She was placed in the dorsal lithotomy position. After an adequate timeout was performed, attention was turned to her pelvis where a speculum was placed in the vagina. The cervix was visualized  and a pap smear was done.   The strings of the IUD were grasped and pulled using ring forceps. The IUD was removed in its entirety.  All instruments were removed from the patient's vagina.  Sponge and instrument counts were correct.  The patient tolerated the procedure well and was taken to the recovery area awake, and in stable condition.   The patient will be discharged to home as per PACU criteria. Routine postoperative instructions given.  She will [facility] as needed for further contraception, she is considering Depo  Provera.   Verita Schneiders, MD, [place] for [employer], [place]

## 2021-07-29 NOTE — Discharge Instructions (Addendum)
DISCHARGE INSTRUCTIONS: D&C  The following instructions have been prepared to help you care for yourself upon your return home.   Personal hygiene:  Use sanitary pads for vaginal drainage, not tampons.  Shower the day after your procedure.  NO tub baths, pools or Jacuzzis for 2-3 weeks.  Wipe front to back after using the bathroom.  Activity and limitations:  Do NOT drive or operate any equipment for 24 hours. The effects of anesthesia are still present and drowsiness may result.  Do NOT rest in bed all day.  Walking is encouraged.  Walk up and down stairs slowly.  You may resume your normal activity in one to two days or as indicated by your physician.  Sexual activity: NO intercourse for at least 2 weeks after the procedure, or as indicated by your physician.  Diet: Eat a light meal as desired this evening. You may resume your usual diet tomorrow.  Return to work: You may resume your work activities in one to two days or as indicated by your doctor.  What to expect after your surgery: Expect to have vaginal bleeding/discharge for 2-3 days and spotting for up to 10 days. It is not unusual to have soreness for up to 1-2 weeks. You may have a slight burning sensation when you urinate for the first day. Mild cramps may continue for a couple of days. You may have a regular period in 2-6 weeks.  Call your doctor for any of the following:  Excessive vaginal bleeding, saturating and changing one pad every hour.  Inability to urinate 6 hours after discharge from hospital.  Pain not relieved by pain medication.  Fever of 100.4 F or greater.  Unusual vaginal discharge or odor.     Post Anesthesia Home Care Instructions  Activity: Get plenty of rest for the remainder of the day. A responsible individual must stay with you for 24 hours following the procedure.  For the next 24 hours, DO NOT: -Drive a car -Advertising copywriter -Drink alcoholic beverages -Take any medication unless  instructed by your physician -Make any legal decisions or sign important papers.  Meals: Start with liquid foods such as gelatin or soup. Progress to regular foods as tolerated. Avoid greasy, spicy, heavy foods. If nausea and/or vomiting occur, drink only clear liquids until the nausea and/or vomiting subsides. Call your physician if vomiting continues.  Special Instructions/Symptoms: Your throat may feel dry or sore from the anesthesia or the breathing tube placed in your throat during surgery. If this causes discomfort, gargle with warm salt water. The discomfort should disappear within 24 hours.  If you had a scopolamine patch placed behind your ear for the management of post- operative nausea and/or vomiting:  1. The medication in the patch is effective for 72 hours, after which it should be removed.  Wrap patch in a tissue and discard in the trash. Wash hands thoroughly with soap and water. 2. You may remove the patch earlier than 72 hours if you experience unpleasant side effects which may include dry mouth, dizziness or visual disturbances. 3. Avoid touching the patch. Wash your hands with soap and water after contact with the patch.        No acetaminophen/Tylenol until after 4:00pm today if needed for pain.  No ibuprofen, Advil, Aleve, Motrin, ketorolac, meloxicam, naproxen, or other NSAIDS until after 6:45 pm today if needed for pain.

## 2021-07-29 NOTE — Anesthesia Postprocedure Evaluation (Signed)
Anesthesia Post Note  Patient: Verlisa Vara  Procedure(s) Performed: EXAM UNDER ANESTHESIA (Vagina ) INTRAUTERINE DEVICE (IUD) REMOVAL AND PAP SMEAR (Vagina )     Patient location during evaluation: PACU Anesthesia Type: MAC Level of consciousness: awake Pain management: pain level controlled Vital Signs Assessment: post-procedure vital signs reviewed and stable Respiratory status: spontaneous breathing, nonlabored ventilation, respiratory function stable and patient connected to nasal cannula oxygen Cardiovascular status: stable and blood pressure returned to baseline Postop Assessment: no apparent nausea or vomiting Anesthetic complications: no   No notable events documented.  Last Vitals:  Vitals:   07/29/21 1330 07/29/21 1402  BP: 104/63 (!) 104/59  Pulse: 65 75  Resp: 14 14  Temp: (!) 36.3 C 36.6 C  SpO2: 98% 97%    Last Pain:  Vitals:   07/29/21 1402  TempSrc:   PainSc: 0-No pain                 Bayle Calvo P Meyer Arora

## 2021-07-29 NOTE — Anesthesia Preprocedure Evaluation (Addendum)
Anesthesia Evaluation  Patient identified by MRN, date of birth, ID band Patient awake    Reviewed: Allergy & Precautions, NPO status , Patient's Chart, lab work & pertinent test results  Airway Mallampati: II  TM Distance: >3 FB Neck ROM: Full    Dental no notable dental hx.    Pulmonary neg pulmonary ROS,    Pulmonary exam normal breath sounds clear to auscultation       Cardiovascular negative cardio ROS Normal cardiovascular exam Rhythm:Regular Rate:Normal     Neuro/Psych  Headaches, Anxiety    GI/Hepatic negative GI ROS, Neg liver ROS,   Endo/Other  negative endocrine ROS  Renal/GU negative Renal ROS     Musculoskeletal negative musculoskeletal ROS (+)   Abdominal (+) + obese,   Peds  Hematology   Anesthesia Other Findings Encounter for female exam  Encounter for IUD removal  Reproductive/Obstetrics hcg negative                            Anesthesia Physical Anesthesia Plan  ASA: 2  Anesthesia Plan: MAC   Post-op Pain Management:    Induction: Intravenous  PONV Risk Score and Plan: 3 and Ondansetron, Dexamethasone, Midazolam and Treatment may vary due to age or medical condition  Airway Management Planned: Natural Airway  Additional Equipment:   Intra-op Plan:   Post-operative Plan:   Informed Consent: I have reviewed the patients History and Physical, chart, labs and discussed the procedure including the risks, benefits and alternatives for the proposed anesthesia with the patient or authorized representative who has indicated his/her understanding and acceptance.     Dental advisory given  Plan Discussed with: CRNA  Anesthesia Plan Comments:        Anesthesia Quick Evaluation

## 2021-07-29 NOTE — Progress Notes (Signed)
Patient discharged home in stable condition with husband Sirel.

## 2021-07-29 NOTE — Interval H&P Note (Signed)
History and Physical Interval Note 07/29/2021 10:55 AM  Stacie Carney  has presented today for surgery, with the diagnosis of extreme anxiety about pelvic examinations in office, desire for IUD removal. The various methods of treatment have been discussed with the patient and family using the help of an Arabic interpreter. After consideration of risks, benefits and other options for treatment, the patient has consented to   EXAM UNDER ANESTHESIA, PAP SMEAR, INTRAUTERINE DEVICE (IUD) REMOVAL as a surgical intervention.  The patient's history has been reviewed, patient examined, no change in status, stable for surgery.  I have reviewed the patient's chart and labs.  Questions were answered to the patient's satisfaction.  To OR when ready.    Jaynie Collins, MD, FACOG Obstetrician & Gynecologist, [facility] for [employer], [facility]

## 2021-07-30 ENCOUNTER — Encounter (HOSPITAL_BASED_OUTPATIENT_CLINIC_OR_DEPARTMENT_OTHER): Payer: Self-pay | Admitting: Obstetrics & Gynecology

## 2021-08-04 LAB — CYTOLOGY - PAP
Comment: NEGATIVE
Diagnosis: NEGATIVE
Diagnosis: REACTIVE
High risk HPV: NEGATIVE

## 2021-08-06 ENCOUNTER — Telehealth: Payer: Self-pay

## 2021-08-06 NOTE — Telephone Encounter (Addendum)
-----   Message from Tereso Newcomer, MD sent at 08/04/2021  9:30 AM EST ----- Normal pap smear and negative high-risk HPV on 07/29/21.  Can repeat in five years due to extreme anxiety with pelvic exams; this had to be done under sedation in the OR.  Please call to inform patient of results and recommendations. Arabic speaking.  Called pt with [facility] interpreter [phone]. Results and provider recommendation reviewed with patient.

## 2022-03-02 ENCOUNTER — Other Ambulatory Visit: Payer: Self-pay

## 2022-03-02 ENCOUNTER — Inpatient Hospital Stay (HOSPITAL_COMMUNITY)
Admission: AD | Admit: 2022-03-02 | Discharge: 2022-03-02 | Disposition: A | Discharge status: Home / Self Care | Payer: BC Managed Care – PPO | Attending: Obstetrics & Gynecology | Admitting: Obstetrics & Gynecology

## 2022-03-02 ENCOUNTER — Encounter (HOSPITAL_COMMUNITY): Payer: Self-pay | Admitting: Obstetrics & Gynecology

## 2022-03-02 DIAGNOSIS — O26891 Other specified pregnancy related conditions, first trimester: Secondary | ICD-10-CM | POA: Diagnosis present

## 2022-03-02 DIAGNOSIS — Z3A01 Less than 8 weeks gestation of pregnancy: Secondary | ICD-10-CM | POA: Diagnosis not present

## 2022-03-02 DIAGNOSIS — Z711 Person with feared health complaint in whom no diagnosis is made: Secondary | ICD-10-CM

## 2022-03-02 LAB — URINALYSIS, ROUTINE W REFLEX MICROSCOPIC
Bilirubin Urine: NEGATIVE
Glucose, UA: NEGATIVE mg/dL
Hgb urine dipstick: NEGATIVE
Ketones, ur: NEGATIVE mg/dL
Leukocytes,Ua: NEGATIVE
Nitrite: NEGATIVE
Protein, ur: NEGATIVE mg/dL
Specific Gravity, Urine: 1.006 (ref 1.005–1.030)
pH: 6 (ref 5.0–8.0)

## 2022-03-02 NOTE — Discharge Instructions (Signed)
[place] [facility] Providers   [facility]             [address], [place], [place] [address] [phone]  [facility]                                                             [address], [address], [place], [place], [address] [phone]  [facility]                                    [address], [address], [place], [place], [address] [phone]  [facility] [place] [address], [place], [place], [address] [phone]  [facility]                                 [address], [place], [place], [address] [phone]  [facility]                                    [address], [place], [place], [address] [phone]  [facility] [address], [address], [place], [place], [address]                              [facility] [address], [place], [place], [address] [phone]  [facility]         Phone: [phone]  [facility]      Phone: [phone]   [facility]      Phone: [phone]  [facility]         Phone: [phone]   [facility]    Phone: [phone]  [facility]      Phone: [phone]  [facility]     Phone: [phone]  [facility]        Phone: [phone]  [facility]      Phone: [phone]  

## 2022-03-02 NOTE — MAU Note (Signed)
Stacie Carney is a 42 y.o. here in MAU reporting: thinks she is pregnant because she is having some symptoms. Has been feeling dizzy and has a poor appetite. No dizziness currently. No pain currently, occasionally has upper abdominal pain but states this has been going on for 5 years and is not a new issue. No bleeding or abnormal discharge.   LMP: 01/12/2022  Onset of complaint: ongoing  Pain score: 0/10  Vitals:   03/02/22 1722  BP: (!) 115/55  Pulse: 88  Resp: 16  Temp: 99.3 F (37.4 C)  SpO2: 98%     Lab orders placed from triage: upt, ua

## 2022-03-02 NOTE — MAU Provider Note (Signed)
Event Date/Time   First Provider Initiated Contact with Patient 03/02/22 1724     S Ms. Stacie Carney is a 41 y.o. [phone] pregnant female at [redacted]w[redacted]d (by LMP) who presents to MAU today with complaint of possible pregnancy with occasional dizziness (none now and no syncopal episodes), nausea with occasional vomiting in the mornings but none now. No physical complaints today.  Pertinent items noted in HPI and remainder of comprehensive ROS otherwise negative.   Has not established prenatal or routine gynecologic care yet.  O BP (!) 115/55 (BP Location: Right Arm)   Pulse 88   Temp 99.3 F (37.4 C) (Oral)   Resp 16   Wt 203 lb 8 oz (92.3 kg)   LMP 01/12/2022   SpO2 98% Comment: room air  BMI 36.63 kg/m  Physical Exam Vitals and nursing note reviewed.  Constitutional:      Appearance: Normal appearance.  HENT:     Head: Normocephalic and atraumatic.  Eyes:     Pupils: Pupils are equal, round, and reactive to light.  Cardiovascular:     Rate and Rhythm: Normal rate and regular rhythm.  Pulmonary:     Effort: Pulmonary effort is normal.  Musculoskeletal:        General: Normal range of motion.  Skin:    General: Skin is warm and dry.     Capillary Refill: Capillary refill takes less than 2 seconds.  Neurological:     Mental Status: She is alert and oriented to person, place, and time.  Psychiatric:        Mood and Affect: Mood normal.        Behavior: Behavior normal.        Thought Content: Thought content normal.        Judgment: Judgment normal.    Results for orders placed or performed during the hospital encounter of 03/02/22 (from the past 24 hour(s))  Urinalysis, Routine w reflex microscopic Urine, Clean Catch     Status: Abnormal   Collection Time: 03/02/22  5:16 PM  Result Value Ref Range   Color, Urine STRAW (A) YELLOW   APPearance CLEAR CLEAR   Specific Gravity, Urine 1.006 1.005 - 1.030   pH 6.0 5.0 - 8.0   Glucose, UA NEGATIVE NEGATIVE mg/dL   Hgb urine  dipstick NEGATIVE NEGATIVE   Bilirubin Urine NEGATIVE NEGATIVE   Ketones, ur NEGATIVE NEGATIVE mg/dL   Protein, ur NEGATIVE NEGATIVE mg/dL   Nitrite NEGATIVE NEGATIVE   Leukocytes,Ua NEGATIVE NEGATIVE  UPT positive  A [redacted] weeks gestation of pregnancy Physically well but worried Medical screening exam complete  P Discharge from MAU in stable condition [facility] list given with instructions to choose a provider and call to set up care - pt lives closest to [facility] so planning to go there for care.  Patient may return to MAU as needed for emergent OB/GYN related complaints  Edd Arbour, CNM, MSN, IBCLC Certified Nurse Midwife, [facility]

## 2022-04-13 LAB — OB RESULTS CONSOLE HIV ANTIBODY (ROUTINE TESTING): HIV: NONREACTIVE

## 2022-04-13 LAB — HEPATITIS C ANTIBODY: HCV Ab: NEGATIVE

## 2022-04-13 LAB — OB RESULTS CONSOLE RUBELLA ANTIBODY, IGM: Rubella: IMMUNE

## 2022-04-13 LAB — OB RESULTS CONSOLE GC/CHLAMYDIA
Chlamydia: NEGATIVE
Neisseria Gonorrhea: NEGATIVE

## 2022-04-13 LAB — OB RESULTS CONSOLE ABO/RH: RH Type: POSITIVE

## 2022-04-13 LAB — OB RESULTS CONSOLE RPR: RPR: NONREACTIVE

## 2022-04-13 LAB — OB RESULTS CONSOLE HEPATITIS B SURFACE ANTIGEN: Hepatitis B Surface Ag: NEGATIVE

## 2022-04-13 LAB — OB RESULTS CONSOLE ANTIBODY SCREEN: Antibody Screen: NEGATIVE

## 2022-09-21 LAB — OB RESULTS CONSOLE GBS: GBS: NEGATIVE

## 2022-10-11 ENCOUNTER — Telehealth (HOSPITAL_COMMUNITY): Payer: Self-pay | Admitting: *Deleted

## 2022-10-11 NOTE — Patient Instructions (Signed)
Stacie Carney  10/11/2022   Your procedure is scheduled on:  10/18/2022  Arrive at [place] at Entrance C on [address] at [facility]  and [employer]. You are invited to use the FREE valet parking or use the Visitor's parking deck.  Pick up the phone at the desk and dial [phone].  Call this number if you have problems the morning of surgery: [phone]  Remember:   Do not eat food:(After Midnight) Desps de medianoche.  Do not drink clear liquids: (After Midnight) Desps de medianoche.  Take these medicines the morning of surgery with A SIP OF WATER:  none   Do not wear jewelry, make-up or nail polish.  Do not wear lotions, powders, or perfumes. Do not wear deodorant.  Do not shave 48 hours prior to surgery.  Do not bring valuables to the hospital.  [facility] is not   responsible for any belongings or valuables brought to the hospital.  Contacts, dentures or bridgework may not be worn into surgery.  Leave suitcase in the car. After surgery it may be brought to your room.  For patients admitted to the hospital, checkout time is 11:00 AM the day of              discharge.      Please read over the following fact sheets that you were given:     Preparing for Surgery

## 2022-10-11 NOTE — Telephone Encounter (Signed)
Preadmission screen  

## 2022-10-11 NOTE — Pre-Procedure Instructions (Signed)
Interpreter number [phone]

## 2022-10-12 ENCOUNTER — Telehealth (HOSPITAL_COMMUNITY): Payer: Self-pay | Admitting: *Deleted

## 2022-10-12 NOTE — Pre-Procedure Instructions (Signed)
Interpreter number [phone]

## 2022-10-12 NOTE — Telephone Encounter (Signed)
Preadmission screen  

## 2022-10-14 NOTE — Pre-Procedure Instructions (Signed)
Interpreter number [phone]

## 2022-10-15 ENCOUNTER — Encounter (HOSPITAL_COMMUNITY)
Admission: RE | Admit: 2022-10-15 | Discharge: 2022-10-15 | Disposition: A | Discharge status: Home / Self Care | Payer: BC Managed Care – PPO | Source: Ambulatory Visit | Attending: Obstetrics & Gynecology | Admitting: Obstetrics & Gynecology

## 2022-10-15 ENCOUNTER — Encounter (HOSPITAL_COMMUNITY): Payer: Self-pay

## 2022-10-15 ENCOUNTER — Encounter (HOSPITAL_COMMUNITY): Admission: RE | Admit: 2022-10-15 | Payer: BC Managed Care – PPO | Source: Ambulatory Visit

## 2022-10-15 VITALS — Ht 64.0 in

## 2022-10-15 DIAGNOSIS — Z01812 Encounter for preprocedural laboratory examination: Secondary | ICD-10-CM | POA: Insufficient documentation

## 2022-10-15 DIAGNOSIS — Z98891 History of uterine scar from previous surgery: Secondary | ICD-10-CM | POA: Insufficient documentation

## 2022-10-15 HISTORY — DX: Nausea with vomiting, unspecified: R11.2

## 2022-10-15 HISTORY — DX: Personal history of other infectious and parasitic diseases: Z86.19

## 2022-10-15 LAB — CBC
HCT: 32 % — ABNORMAL LOW (ref 36.0–46.0)
Hemoglobin: 9.7 g/dL — ABNORMAL LOW (ref 12.0–15.0)
MCH: 24.1 pg — ABNORMAL LOW (ref 26.0–34.0)
MCHC: 30.3 g/dL (ref 30.0–36.0)
MCV: 79.6 fL — ABNORMAL LOW (ref 80.0–100.0)
Platelets: 313 10*3/uL (ref 150–400)
RBC: 4.02 MIL/uL (ref 3.87–5.11)
RDW: 16.6 % — ABNORMAL HIGH (ref 11.5–15.5)
WBC: 8.2 10*3/uL (ref 4.0–10.5)
nRBC: 0 % (ref 0.0–0.2)

## 2022-10-15 LAB — RPR: RPR Ser Ql: NONREACTIVE

## 2022-10-15 NOTE — Pre-Procedure Instructions (Signed)
[phone] interpreter number

## 2022-10-15 NOTE — Pre-Procedure Instructions (Signed)
[phone] interpreter number

## 2022-10-17 ENCOUNTER — Encounter (HOSPITAL_COMMUNITY): Payer: Self-pay | Admitting: Obstetrics and Gynecology

## 2022-10-18 ENCOUNTER — Encounter (HOSPITAL_COMMUNITY): Payer: Self-pay | Admitting: Obstetrics and Gynecology

## 2022-10-18 ENCOUNTER — Encounter (HOSPITAL_COMMUNITY)
Admission: AD | Disposition: A | Discharge status: Home / Self Care | Payer: Self-pay | Source: Home / Self Care | Attending: Obstetrics and Gynecology

## 2022-10-18 ENCOUNTER — Other Ambulatory Visit: Payer: Self-pay

## 2022-10-18 ENCOUNTER — Inpatient Hospital Stay (HOSPITAL_COMMUNITY): Payer: BC Managed Care – PPO | Admitting: Anesthesiology

## 2022-10-18 ENCOUNTER — Inpatient Hospital Stay (HOSPITAL_COMMUNITY)
Admission: AD | Admit: 2022-10-18 | Discharge: 2022-10-20 | DRG: 785 | Disposition: A | Discharge status: Home / Self Care | Payer: BC Managed Care – PPO | Attending: Obstetrics and Gynecology | Admitting: Obstetrics and Gynecology

## 2022-10-18 DIAGNOSIS — O9902 Anemia complicating childbirth: Secondary | ICD-10-CM | POA: Diagnosis present

## 2022-10-18 DIAGNOSIS — Z3A39 39 weeks gestation of pregnancy: Secondary | ICD-10-CM | POA: Diagnosis not present

## 2022-10-18 DIAGNOSIS — O321XX0 Maternal care for breech presentation, not applicable or unspecified: Principal | ICD-10-CM | POA: Diagnosis present

## 2022-10-18 DIAGNOSIS — N838 Other noninflammatory disorders of ovary, fallopian tube and broad ligament: Secondary | ICD-10-CM | POA: Diagnosis present

## 2022-10-18 DIAGNOSIS — L309 Dermatitis, unspecified: Secondary | ICD-10-CM | POA: Diagnosis present

## 2022-10-18 DIAGNOSIS — Z98891 History of uterine scar from previous surgery: Secondary | ICD-10-CM

## 2022-10-18 DIAGNOSIS — O99892 Other specified diseases and conditions complicating childbirth: Secondary | ICD-10-CM | POA: Diagnosis present

## 2022-10-18 DIAGNOSIS — O3483 Maternal care for other abnormalities of pelvic organs, third trimester: Secondary | ICD-10-CM | POA: Diagnosis present

## 2022-10-18 DIAGNOSIS — O34219 Maternal care for unspecified type scar from previous cesarean delivery: Secondary | ICD-10-CM | POA: Diagnosis present

## 2022-10-18 DIAGNOSIS — O9972 Diseases of the skin and subcutaneous tissue complicating childbirth: Secondary | ICD-10-CM | POA: Diagnosis present

## 2022-10-18 DIAGNOSIS — R7303 Prediabetes: Secondary | ICD-10-CM | POA: Diagnosis present

## 2022-10-18 DIAGNOSIS — Z302 Encounter for sterilization: Secondary | ICD-10-CM

## 2022-10-18 LAB — PREPARE RBC (CROSSMATCH)

## 2022-10-18 LAB — ABO/RH: ABO/RH(D): A POS

## 2022-10-18 SURGERY — Surgical Case
Anesthesia: Spinal

## 2022-10-18 MED ORDER — OXYTOCIN-SODIUM CHLORIDE 30-0.9 UT/500ML-% IV SOLN
INTRAVENOUS | Status: DC | PRN
  Administered 2022-10-18: 300 mL via INTRAVENOUS

## 2022-10-18 MED ORDER — DEXMEDETOMIDINE HCL IN NACL 80 MCG/20ML IV SOLN
INTRAVENOUS | Status: AC
  Filled 2022-10-18: qty 20

## 2022-10-18 MED ORDER — SIMETHICONE 80 MG PO CHEW
80.0000 mg | CHEWABLE_TABLET | Freq: Three times a day (TID) | ORAL | Status: DC
  Administered 2022-10-18 – 2022-10-19 (×3): 80 mg via ORAL
  Filled 2022-10-18 (×4): qty 1

## 2022-10-18 MED ORDER — METOCLOPRAMIDE HCL 5 MG/ML IJ SOLN
INTRAMUSCULAR | Status: AC
  Filled 2022-10-18: qty 2

## 2022-10-18 MED ORDER — DEXAMETHASONE SODIUM PHOSPHATE 10 MG/ML IJ SOLN
INTRAMUSCULAR | Status: DC | PRN
  Administered 2022-10-18: 8 mg via INTRAVENOUS

## 2022-10-18 MED ORDER — CEFAZOLIN SODIUM-DEXTROSE 2-4 GM/100ML-% IV SOLN
INTRAVENOUS | Status: AC
  Filled 2022-10-18: qty 100

## 2022-10-18 MED ORDER — MEPERIDINE HCL 25 MG/ML IJ SOLN: 6.2500 mg | INTRAMUSCULAR | Status: DC | PRN

## 2022-10-18 MED ORDER — PHENYLEPHRINE HCL-NACL 20-0.9 MG/250ML-% IV SOLN
INTRAVENOUS | Status: DC | PRN
  Administered 2022-10-18: 60 ug/min via INTRAVENOUS

## 2022-10-18 MED ORDER — SENNOSIDES-DOCUSATE SODIUM 8.6-50 MG PO TABS
2.0000 | ORAL_TABLET | Freq: Every day | ORAL | Status: DC
  Administered 2022-10-19 – 2022-10-20 (×2): 2 via ORAL
  Filled 2022-10-18 (×2): qty 2

## 2022-10-18 MED ORDER — COCONUT OIL OIL: 1.0000 | TOPICAL_OIL | Status: DC | PRN

## 2022-10-18 MED ORDER — ACETAMINOPHEN 10 MG/ML IV SOLN
1000.0000 mg | Freq: Once | INTRAVENOUS | Status: DC | PRN
  Administered 2022-10-18: 1000 mg via INTRAVENOUS

## 2022-10-18 MED ORDER — KETOROLAC TROMETHAMINE 30 MG/ML IJ SOLN
INTRAMUSCULAR | Status: AC
  Filled 2022-10-18: qty 1

## 2022-10-18 MED ORDER — OXYCODONE HCL 5 MG PO TABS: 5.0000 mg | ORAL_TABLET | Freq: Once | ORAL | Status: DC | PRN

## 2022-10-18 MED ORDER — ZOLPIDEM TARTRATE 5 MG PO TABS: 5.0000 mg | ORAL_TABLET | Freq: Every evening | ORAL | Status: DC | PRN

## 2022-10-18 MED ORDER — KETOROLAC TROMETHAMINE 30 MG/ML IJ SOLN
30.0000 mg | Freq: Once | INTRAMUSCULAR | Status: AC
  Administered 2022-10-18: 30 mg via INTRAVENOUS

## 2022-10-18 MED ORDER — STERILE WATER FOR IRRIGATION IR SOLN
Status: DC | PRN
  Administered 2022-10-18: 1

## 2022-10-18 MED ORDER — CHLOROPROCAINE HCL (PF) 3 % IJ SOLN
INTRAMUSCULAR | Status: DC | PRN
  Administered 2022-10-18: 20 mL

## 2022-10-18 MED ORDER — FENTANYL CITRATE (PF) 100 MCG/2ML IJ SOLN
INTRAMUSCULAR | Status: DC | PRN
  Administered 2022-10-18: 15 ug via INTRATHECAL

## 2022-10-18 MED ORDER — ONDANSETRON HCL 4 MG/2ML IJ SOLN
INTRAMUSCULAR | Status: AC
  Filled 2022-10-18: qty 2

## 2022-10-18 MED ORDER — OXYCODONE HCL 5 MG/5ML PO SOLN: 5.0000 mg | Freq: Once | ORAL | Status: DC | PRN

## 2022-10-18 MED ORDER — CHLOROPROCAINE HCL (PF) 3 % IJ SOLN
INTRAMUSCULAR | Status: AC
  Filled 2022-10-18: qty 20

## 2022-10-18 MED ORDER — PROMETHAZINE HCL 25 MG/ML IJ SOLN: 6.2500 mg | INTRAMUSCULAR | Status: DC | PRN

## 2022-10-18 MED ORDER — ACETAMINOPHEN 10 MG/ML IV SOLN
INTRAVENOUS | Status: AC
  Filled 2022-10-18: qty 100

## 2022-10-18 MED ORDER — DEXMEDETOMIDINE HCL IN NACL 80 MCG/20ML IV SOLN
INTRAVENOUS | Status: DC | PRN
  Administered 2022-10-18 (×2): 8 ug via INTRAVENOUS

## 2022-10-18 MED ORDER — PHENYLEPHRINE HCL (PRESSORS) 10 MG/ML IV SOLN
INTRAVENOUS | Status: DC | PRN
  Administered 2022-10-18: 160 ug via INTRAVENOUS
  Administered 2022-10-18: 80 ug via INTRAVENOUS
  Administered 2022-10-18 (×2): 240 ug via INTRAVENOUS
  Administered 2022-10-18 (×2): 160 ug via INTRAVENOUS

## 2022-10-18 MED ORDER — ALBUMIN HUMAN 5 % IV SOLN
INTRAVENOUS | Status: AC
  Filled 2022-10-18: qty 500

## 2022-10-18 MED ORDER — DIBUCAINE (PERIANAL) 1 % EX OINT: 1.0000 | TOPICAL_OINTMENT | CUTANEOUS | Status: DC | PRN

## 2022-10-18 MED ORDER — SIMETHICONE 80 MG PO CHEW
80.0000 mg | CHEWABLE_TABLET | ORAL | Status: DC | PRN
  Filled 2022-10-18: qty 1

## 2022-10-18 MED ORDER — BUPIVACAINE IN DEXTROSE 0.75-8.25 % IT SOLN
INTRATHECAL | Status: DC | PRN
  Administered 2022-10-18: 1.6 mL via INTRATHECAL

## 2022-10-18 MED ORDER — PHENYLEPHRINE HCL-NACL 20-0.9 MG/250ML-% IV SOLN
INTRAVENOUS | Status: AC
  Filled 2022-10-18: qty 250

## 2022-10-18 MED ORDER — OXYTOCIN-SODIUM CHLORIDE 30-0.9 UT/500ML-% IV SOLN
2.5000 [IU]/h | INTRAVENOUS | Status: AC
  Administered 2022-10-18: 2.5 [IU]/h via INTRAVENOUS

## 2022-10-18 MED ORDER — CEFAZOLIN SODIUM-DEXTROSE 2-4 GM/100ML-% IV SOLN
2.0000 g | INTRAVENOUS | Status: AC
  Administered 2022-10-18: 2 g via INTRAVENOUS

## 2022-10-18 MED ORDER — OXYTOCIN-SODIUM CHLORIDE 30-0.9 UT/500ML-% IV SOLN
INTRAVENOUS | Status: AC
  Filled 2022-10-18: qty 500

## 2022-10-18 MED ORDER — TRANEXAMIC ACID-NACL 1000-0.7 MG/100ML-% IV SOLN
INTRAVENOUS | Status: DC | PRN
  Administered 2022-10-18: 1000 mg via INTRAVENOUS

## 2022-10-18 MED ORDER — OXYCODONE HCL 5 MG PO TABS: 5.0000 mg | ORAL_TABLET | ORAL | Status: DC | PRN

## 2022-10-18 MED ORDER — DEXAMETHASONE SODIUM PHOSPHATE 4 MG/ML IJ SOLN
INTRAMUSCULAR | Status: AC
  Filled 2022-10-18: qty 2

## 2022-10-18 MED ORDER — EPHEDRINE SULFATE-NACL 50-0.9 MG/10ML-% IV SOSY
PREFILLED_SYRINGE | INTRAVENOUS | Status: DC | PRN
  Administered 2022-10-18 (×2): 5 mg via INTRAVENOUS

## 2022-10-18 MED ORDER — METHYLERGONOVINE MALEATE 0.2 MG/ML IJ SOLN
INTRAMUSCULAR | Status: DC | PRN
  Administered 2022-10-18: .2 mg via INTRAMUSCULAR

## 2022-10-18 MED ORDER — WITCH HAZEL-GLYCERIN EX PADS: 1.0000 | MEDICATED_PAD | CUTANEOUS | Status: DC | PRN

## 2022-10-18 MED ORDER — FENTANYL CITRATE (PF) 100 MCG/2ML IJ SOLN: 25.0000 ug | INTRAMUSCULAR | Status: DC | PRN

## 2022-10-18 MED ORDER — ALBUMIN HUMAN 5 % IV SOLN: INTRAVENOUS | Status: DC | PRN

## 2022-10-18 MED ORDER — MORPHINE SULFATE (PF) 0.5 MG/ML IJ SOLN
INTRAMUSCULAR | Status: DC | PRN
  Administered 2022-10-18: 150 ug via INTRATHECAL

## 2022-10-18 MED ORDER — METHYLERGONOVINE MALEATE 0.2 MG/ML IJ SOLN
INTRAMUSCULAR | Status: AC
  Filled 2022-10-18: qty 1

## 2022-10-18 MED ORDER — ACETAMINOPHEN 500 MG PO TABS
1000.0000 mg | ORAL_TABLET | Freq: Four times a day (QID) | ORAL | Status: DC
  Administered 2022-10-18 – 2022-10-20 (×8): 1000 mg via ORAL
  Filled 2022-10-18 (×8): qty 2

## 2022-10-18 MED ORDER — SODIUM CHLORIDE 0.9 % IR SOLN
Status: DC | PRN
  Administered 2022-10-18: 1

## 2022-10-18 MED ORDER — PRENATAL MULTIVITAMIN CH
1.0000 | ORAL_TABLET | Freq: Every day | ORAL | Status: DC
  Administered 2022-10-19 – 2022-10-20 (×2): 1 via ORAL
  Filled 2022-10-18 (×3): qty 1

## 2022-10-18 MED ORDER — SOD CITRATE-CITRIC ACID 500-334 MG/5ML PO SOLN
30.0000 mL | ORAL | Status: AC
  Administered 2022-10-18: 30 mL via ORAL

## 2022-10-18 MED ORDER — MENTHOL 3 MG MT LOZG: 1.0000 | LOZENGE | OROMUCOSAL | Status: DC | PRN

## 2022-10-18 MED ORDER — TRANEXAMIC ACID-NACL 1000-0.7 MG/100ML-% IV SOLN
INTRAVENOUS | Status: AC
  Filled 2022-10-18: qty 100

## 2022-10-18 MED ORDER — DIPHENHYDRAMINE HCL 25 MG PO CAPS
25.0000 mg | ORAL_CAPSULE | Freq: Four times a day (QID) | ORAL | Status: DC | PRN
  Administered 2022-10-18: 25 mg via ORAL
  Filled 2022-10-18: qty 1

## 2022-10-18 MED ORDER — KETOROLAC TROMETHAMINE 30 MG/ML IJ SOLN
30.0000 mg | Freq: Four times a day (QID) | INTRAMUSCULAR | Status: AC
  Administered 2022-10-18 – 2022-10-19 (×3): 30 mg via INTRAVENOUS
  Filled 2022-10-18 (×3): qty 1

## 2022-10-18 MED ORDER — SODIUM CHLORIDE 0.9 % IV SOLN
500.0000 mg | INTRAVENOUS | Status: AC
  Administered 2022-10-18: 500 mg via INTRAVENOUS
  Filled 2022-10-18: qty 5

## 2022-10-18 MED ORDER — IBUPROFEN 600 MG PO TABS
600.0000 mg | ORAL_TABLET | Freq: Four times a day (QID) | ORAL | Status: DC
  Administered 2022-10-19 – 2022-10-20 (×5): 600 mg via ORAL
  Filled 2022-10-18 (×5): qty 1

## 2022-10-18 MED ORDER — FENTANYL CITRATE (PF) 100 MCG/2ML IJ SOLN
INTRAMUSCULAR | Status: AC
  Filled 2022-10-18: qty 2

## 2022-10-18 MED ORDER — LACTATED RINGERS IV SOLN: INTRAVENOUS | Status: DC | PRN

## 2022-10-18 MED ORDER — SOD CITRATE-CITRIC ACID 500-334 MG/5ML PO SOLN
ORAL | Status: AC
  Filled 2022-10-18: qty 30

## 2022-10-18 MED ORDER — ONDANSETRON HCL 4 MG/2ML IJ SOLN
INTRAMUSCULAR | Status: DC | PRN
  Administered 2022-10-18: 4 mg via INTRAVENOUS

## 2022-10-18 MED ORDER — MORPHINE SULFATE (PF) 0.5 MG/ML IJ SOLN
INTRAMUSCULAR | Status: AC
  Filled 2022-10-18: qty 10

## 2022-10-18 MED ORDER — AMISULPRIDE (ANTIEMETIC) 5 MG/2ML IV SOLN: 10.0000 mg | Freq: Once | INTRAVENOUS | Status: DC | PRN

## 2022-10-18 MED ORDER — METOCLOPRAMIDE HCL 5 MG/ML IJ SOLN
INTRAMUSCULAR | Status: DC | PRN
  Administered 2022-10-18: 5 mg via INTRAVENOUS

## 2022-10-18 MED ORDER — PHENYLEPHRINE 80 MCG/ML (10ML) SYRINGE FOR IV PUSH (FOR BLOOD PRESSURE SUPPORT)
PREFILLED_SYRINGE | INTRAVENOUS | Status: AC
  Filled 2022-10-18: qty 10

## 2022-10-18 MED ORDER — ENOXAPARIN SODIUM 40 MG/0.4ML IJ SOSY
40.0000 mg | PREFILLED_SYRINGE | INTRAMUSCULAR | Status: DC
  Administered 2022-10-19 – 2022-10-20 (×2): 40 mg via SUBCUTANEOUS
  Filled 2022-10-18 (×2): qty 0.4

## 2022-10-18 SURGICAL SUPPLY — 40 items
APL PRP STRL LF DISP 70% ISPRP (MISCELLANEOUS) ×2
APL SKNCLS STERI-STRIP NONHPOA (GAUZE/BANDAGES/DRESSINGS) ×1
BENZOIN TINCTURE PRP APPL 2/3 (GAUZE/BANDAGES/DRESSINGS) ×1 IMPLANT
CHLORAPREP W/TINT 26 (MISCELLANEOUS) ×2 IMPLANT
CLAMP UMBILICAL CORD (MISCELLANEOUS) ×1 IMPLANT
CLOTH BEACON ORANGE TIMEOUT ST (SAFETY) ×1 IMPLANT
DRSG OPSITE POSTOP 4X10 (GAUZE/BANDAGES/DRESSINGS) ×1 IMPLANT
ELECT REM PT RETURN 9FT ADLT (ELECTROSURGICAL) ×1
ELECTRODE REM PT RTRN 9FT ADLT (ELECTROSURGICAL) ×1 IMPLANT
EXTRACTOR VACUUM KIWI (MISCELLANEOUS) ×1 IMPLANT
GAUZE PAD ABD 7.5X8 STRL (GAUZE/BANDAGES/DRESSINGS) IMPLANT
GAUZE SPONGE 4X4 12PLY STRL LF (GAUZE/BANDAGES/DRESSINGS) IMPLANT
GLOVE BIO SURGEON STRL SZ 6.5 (GLOVE) ×1 IMPLANT
GLOVE BIOGEL PI IND STRL 7.0 (GLOVE) ×2 IMPLANT
GOWN STRL REUS W/TWL LRG LVL3 (GOWN DISPOSABLE) ×2 IMPLANT
KIT ABG SYR 3ML LUER SLIP (SYRINGE) IMPLANT
LIGASURE IMPACT 36 18CM CVD LR (INSTRUMENTS) IMPLANT
NDL HYPO 25X5/8 SAFETYGLIDE (NEEDLE) IMPLANT
NEEDLE HYPO 25X5/8 SAFETYGLIDE (NEEDLE) IMPLANT
NS IRRIG 1000ML POUR BTL (IV SOLUTION) ×1 IMPLANT
PACK C SECTION WH (CUSTOM PROCEDURE TRAY) ×1 IMPLANT
PAD OB MATERNITY 4.3X12.25 (PERSONAL CARE ITEMS) ×1 IMPLANT
RTRCTR C-SECT PINK 25CM LRG (MISCELLANEOUS) IMPLANT
STRIP CLOSURE SKIN 1/2X4 (GAUZE/BANDAGES/DRESSINGS) ×1 IMPLANT
SUT MON AB 4-0 PS1 27 (SUTURE) ×1 IMPLANT
SUT PLAIN 2 0 (SUTURE) ×2
SUT PLAIN 2 0 XLH (SUTURE) ×1 IMPLANT
SUT PLAIN ABS 2-0 CT1 27XMFL (SUTURE) ×2 IMPLANT
SUT VIC AB 0 CTX 36 (SUTURE) ×4
SUT VIC AB 0 CTX36XBRD ANBCTRL (SUTURE) ×4 IMPLANT
SUT VIC AB 2-0 CT1 27 (SUTURE) ×1
SUT VIC AB 2-0 CT1 TAPERPNT 27 (SUTURE) ×1 IMPLANT
SUT VIC AB 2-0 SH 27 (SUTURE)
SUT VIC AB 2-0 SH 27XBRD (SUTURE) IMPLANT
SUT VIC AB 3-0 CT1 27 (SUTURE) ×1
SUT VIC AB 3-0 CT1 TAPERPNT 27 (SUTURE) ×1 IMPLANT
TAPE CLOTH SURG 4X10 WHT LF (GAUZE/BANDAGES/DRESSINGS) IMPLANT
TOWEL OR 17X24 6PK STRL BLUE (TOWEL DISPOSABLE) ×1 IMPLANT
TRAY FOLEY W/BAG SLVR 14FR LF (SET/KITS/TRAYS/PACK) ×1 IMPLANT
WATER STERILE IRR 1000ML POUR (IV SOLUTION) ×1 IMPLANT

## 2022-10-18 NOTE — Anesthesia Preprocedure Evaluation (Addendum)
Anesthesia Evaluation  Patient identified by MRN, date of birth, ID band Patient awake    Reviewed: Allergy & Precautions, NPO status , Patient's Chart, lab work & pertinent test results  Airway Mallampati: II  TM Distance: >3 FB Neck ROM: Full    Dental no notable dental hx.    Pulmonary neg pulmonary ROS   Pulmonary exam normal        Cardiovascular negative cardio ROS Normal cardiovascular exam     Neuro/Psych  Headaches  Anxiety        GI/Hepatic negative GI ROS, Neg liver ROS,,,  Endo/Other  negative endocrine ROS    Renal/GU negative Renal ROS     Musculoskeletal negative musculoskeletal ROS (+)    Abdominal  (+) + obese  Peds  Hematology  (+) Blood dyscrasia, anemia   Anesthesia Other Findings repeat cesearean section times five 39 weeks which desires sterilization  Reproductive/Obstetrics                             Anesthesia Physical Anesthesia Plan  ASA: 2  Anesthesia Plan: Spinal   Post-op Pain Management:    Induction:   PONV Risk Score and Plan: 2 and Ondansetron, Dexamethasone and Treatment may vary due to age or medical condition  Airway Management Planned: Natural Airway  Additional Equipment:   Intra-op Plan:   Post-operative Plan:   Informed Consent: I have reviewed the patients History and Physical, chart, labs and discussed the procedure including the risks, benefits and alternatives for the proposed anesthesia with the patient or authorized representative who has indicated his/her understanding and acceptance.     Dental advisory given  Plan Discussed with: CRNA  Anesthesia Plan Comments:        Anesthesia Quick Evaluation

## 2022-10-18 NOTE — Anesthesia Procedure Notes (Signed)
Spinal  Patient location during procedure: OR Start time: 10/18/2022 7:50 AM End time: 10/18/2022 7:55 AM Reason for block: surgical anesthesia Staffing Performed: anesthesiologist  Anesthesiologist: Murvin Natal, MD Performed by: Murvin Natal, MD Authorized by: Murvin Natal, MD   Preanesthetic Checklist Completed: patient identified, IV checked, risks and benefits discussed, surgical consent, monitors and equipment checked, pre-op evaluation and timeout performed Spinal Block Patient position: sitting Prep: DuraPrep Patient monitoring: cardiac monitor, continuous pulse ox and blood pressure Approach: midline Location: L4-5 Injection technique: single-shot Needle Needle type: Pencan  Needle gauge: 24 G Needle length: 9 cm Assessment Sensory level: T10 Events: CSF return Additional Notes Functioning IV was confirmed and monitors were applied. Sterile prep and drape, including hand hygiene and sterile gloves were used. The patient was positioned and the spine was prepped. The skin was anesthetized with lidocaine.  Free flow of clear CSF was obtained prior to injecting local anesthetic into the CSF.  The spinal needle aspirated freely following injection.  The needle was carefully withdrawn.  The patient tolerated the procedure well.

## 2022-10-18 NOTE — H&P (Addendum)
Stacie Carney is a 42 y.o. female [id number] at 31w6dpresenting for scheduled repeat c-section with bilateral salpingectomy.  OB History     Gravida  5   Para  4   Term  3   Preterm  1   AB      Living  4      SAB      IAB      Ectopic      Multiple      Live Births  4          Past Medical History:  Diagnosis Date   Dizziness [id number]  History of Helicobacter pylori infection    Iron deficiency anemia due to chronic blood loss 05/25/2019   improved with iron supplementation per 05/25/19 OV note from Dr. EMack Hook  Menorrhagia with regular cycle 03/20/2019   Nausea & vomiting    felt like stomach not digesting food prior to pregnancy   Nonintractable headache 03/20/2019   Past Surgical History:  Procedure Laterality Date   CESAREAN SECTION  2009, 2012, 2014, 2017   IUD REMOVAL N/A 07/29/2021   Procedure: INTRAUTERINE DEVICE (IUD) REMOVAL AND PAP SMEAR;  Surgeon: AOsborne Oman MD;  [place]  Service: [place]necology;  Laterality: N/A;   Family History: family history includes Hypertension in her mother; Other in her mother. Social History:  reports that she has never smoked. She has never used smokeless tobacco. She reports that she does not drink alcohol and does not use drugs.     Maternal Diabetes: No Genetic Screening: Declined Maternal Ultrasounds/Referrals: Normal Fetal Ultrasounds or other Referrals:  None Maternal Substance Abuse:  No Significant Maternal Medications:  None Significant Maternal Lab Results:  Group B Strep negative Number of Prenatal Visits:greater than 3 verified prenatal visits Other Comments:  None  Review of Systems  Constitutional: Negative.   HENT: Negative.    Eyes: Negative.   Respiratory: Negative.    Cardiovascular: Negative.   Gastrointestinal: Negative.   Endocrine: Negative.   Genitourinary: Negative.   All other systems reviewed and are negative.  History   Blood  pressure 112/68, pulse 87, temperature 98.7 F (37.1 C), temperature source Oral, resp. rate 16, height 5' 4"$  (1.626 m), weight 86.9 kg, last menstrual period 01/12/2022, SpO2 98 %. Maternal Exam:  Abdomen: Patient reports no abdominal tenderness. Fetal presentation: vertex   Physical Exam Vitals reviewed.  Constitutional:      Appearance: She is obese.  Cardiovascular:     Rate and Rhythm: Normal rate and regular rhythm.  Pulmonary:     Effort: Pulmonary effort is normal. No respiratory distress.  Abdominal:     General: There is no distension.     Palpations: Abdomen is soft.     Tenderness: There is no abdominal tenderness.  Skin:    General: Skin is warm and dry.  Neurological:     General: No focal deficit present.     Mental Status: She is alert and oriented to person, place, and time.  Psychiatric:        Mood and Affect: Mood normal.     Prenatal labs: ABO, Rh: --/--/PENDING (02/19 [id number] Antibody: NEG (02/16 1057) Rubella: Immune (08/15 0000) RPR: NON REACTIVE (02/16 1057)  HBsAg: Negative (08/15 0000)  HIV: Non-reactive (08/15 0000)  GBS: Negative/-- (01/23 0000)   Assessment/Plan: 42y.o. female [id number] 344w6d/o c-section x4 AMA Pre-gestational pre-diabetes - 1hr GTT elevated, 3hr GTT wnl Preterm delivery - 2017,  due to pelvic pain, no PTL Eczema Desires sterilization Anemia - Hgb: 9.7, Plt 313  Admit to L&D Plan to proceed with repeat cesarean section with bilateral salpingectomy. Risks discussed include bleeding, infection, injury to surrounding organs including but not limited to bowel, bladder, and ureters. Also discussed risk of hysterectomy in case of life-threatening emergency, and death. Pt verbalizes understanding and wishes to proceed. NPO Bicitra 2g Ancef TXA 2U pRBCs on hold General admission labs Patient consent to blood transfusion if needed. R/B/A discussed  Translator used ID# [id number]  Josefine Class 10/18/2022, 6:59  AM

## 2022-10-18 NOTE — Transfer of Care (Signed)
Immediate Anesthesia Transfer of Care Note  Patient: Stacie Carney  Procedure(s) Performed: CESAREAN SECTION WITH BILATERAL TUBAL LIGATION  Patient Location: PACU  Anesthesia Type:Spinal  Level of Consciousness: awake, alert , and oriented  Airway & Oxygen Therapy: Patient Spontanous Breathing  Post-op Assessment: Report given to RN and Post -op Vital signs reviewed and stable  Post vital signs: Reviewed and stable  Last Vitals:  Vitals Value Taken Time  BP 91/49 10/18/22 1001  Temp    Pulse 77 10/18/22 1009  Resp 18 10/18/22 1009  SpO2 94 % 10/18/22 1009  Vitals shown include unvalidated device data.  Last Pain:  Vitals:   10/18/22 1000  TempSrc:   PainSc: 0-No pain         Complications: No notable events documented.

## 2022-10-18 NOTE — Op Note (Signed)
Cesarean Section Procedure Note with Bilateral Salpingectomy  Indications: previous uterine incision kerr x3 or greater  Pre-operative Diagnosis:  42 y.o. female [id number] at 51w6dH/o c-section x4 AMA Pre-gestational pre-diabetes  Preterm delivery - elective Eczema Desires sterilization Anemia  Post-operative Diagnosis: same  Surgeon: EJosefine Class  Assistants: Dr. KAlesia Richards Anesthesia: Spinal anesthesia  ASA Class: 2   Procedure Details  CS OP NOTE Information for the patient's newborn:  MReneasha, Shontz[[id number] @[id number]$ @    Indication and Consent:  Patient reported for scheduled repeat cesarean section. [place] received adequate prenatal care in current pregnancy by [facility]. Risks discussed include bleeding, infection, injury to surrounding organs including but not limited to bowel, bladder, and ureters. Also discussed risk of hysterectomy in case of life-threatening emergency, and death. Pt verbalizes understanding and wishes to proceed.  Procedures: The patient was taken to the operating room where anesthesia was found to be adequate and preoperative antibiotics were given for infection prophylaxis. She was prepared and draped in the dorsal supine position with a leftward tilt. A Pfannenstiel skin incision was made with scalpel. The incision was carried down to the fascia with Bovie electrocautery. The fascia was incised and extended laterally. The superior aspect of the fascia was grasped with Kocher clamps and the rectus muscle was dissected off with mayo scissors. In a similar fashion, the inferior aspect of the fascia was elevated and the underlying rectus muscle and pyramidalis were dissected off. Hemostasis was achieved with the bovie. The rectus muscle was separated in the midline down to the level of pubic symphysis. Pre-peritoneal fatty tissue was extended superiorly and inferiorly to the bladder reflection with  good visualization of the bladder.   The Alexis-O retractor was inserted and vesicouterine peritoneum identified. Intraabdominal survey revealed scant, clear peritoneal fluid and a thick lower uterine segment. A low transverse uterine incision was made with a scalpel, the amniotic sac was ruptured and meconium stained fluid noted. The uterine incision was extended bluntly with bandage scissors.   The fetus was found to be in the frank breech position. Under controlled conditions, the sacrum was delivered and rotated to sacrum anterior. With gentle fundal pressure, the infant was delivered from the (LSO) position. A moist towel was placed around the fetal trunck and the infant was delivered to the level of the scapula. The infant was rotated and both [facility] The head was flexed with the Mauriceau-Smellie-Veit maneuver and delivered atraumatically at 0819.  There was no nuchal cord. The oropharynx and nasopharynx were bulb suctioned immediately following delivery.  The umbilical cord was doubly clamped and cut and the infant was transferred to a warmer with an awaiting NICU team. Cord blood and cord gases obtained. The placenta was delivered intact with manual massage of uterine fundus. IV oxytocin was initiated to facilitated uterine contrations.  The inside of uterus was wiped clean. The uterine incision was closed first with 0 Vicryl in a locking pattern, second imbricating layer by 0 Vicryl  in a running pattern. Hemostasis was achieved with a figure of eight stitch using 0 Vicryl. The ovaries and tubes were found to be normal. There was a 1 cm paratubal cyst noted on the right fallopian tube.  The left fallopian tube was identified and carried out to its fimbriated end and excised with the Ligasure device.  The same was done on the right side. Excellent hemostasis  noted.     The peritoneum was closed with 2-0 Vicryl in a continuous pattern. The fascial layer was closed  with 0 Vicryl suture. Scarpa's fascia was reapproximated with 3-0 plain gut suture in a continuous fashion. The skin was closed with 4-0 monocryl subcuticularly. Steri strips and honeycomb dressing placed followed by pressure dressing. The patient tolerated the procedure well. All the counts were correct times three. Foley catheter was draining clear urine pre-, intra- and immediately post-operatively. The patient was taken to the recovery room in a stable condition. Dr. Ivin Poot was present for the entire procedure and Dr. Alesia Richards assisted.    Findings: Female inafnt in frank breech presentation, normal appearing bilateral tubes and ovaries with 1 cm right simple paratubal cyst visualized  Estimated Blood Loss:   1334 mL         Urine: 200 mL         Total IV Fluids:  2000 mL         Specimens: Placenta, Fallopian Tube, Right, Left, and Disposition:  Sent to Pathology   Complications:  None; patient tolerated the procedure well.         Disposition: PACU - hemodynamically stable.         Condition: stable  Attending Attestation: I was present and scrubbed for the entire procedure. I was present and scrubbed and the assistant was required due to complexity of anatomy. Due to the complexity of the case, Dr. Alesia Richards  was asked to serve as surgeon assist. The assistant surgeon aided in safe handling of the pelvic structures, associated retraction, and exposure of the operative field.    Dr. Bing Matter

## 2022-10-18 NOTE — OR Nursing (Signed)
Used interpreter [phone] Percell Locus during the pacu    Dr. Roanna Banning notified of difficulty with Pulse OX hand cool heater addd to patient and henna on finger tips

## 2022-10-18 NOTE — Lactation Note (Signed)
This note was copied from a baby's chart. Lactation Consultation Note  Patient Name: Stacie Carney [id number] Date: 10/18/2022 Reason for consult: Initial assessment Age:43 hours  Arabic interpreter used for Arabic.  P5, Baby cueing.  Latched baby for 5 min.  Mother states baby recently breastfed for 15 min and received formula. Feed on demand with cues.  Goal 8-12+ times per day after first 24 hrs.  Place baby STS if not cueing.  Provided lactation information sheet.   Maternal Data Has patient been taught Hand Expression?: Yes Does the patient have breastfeeding experience prior to this delivery?: Yes How long did the patient breastfeed?: 1.5 years  Feeding Mother's Current Feeding Choice: Breast Milk and Formula Nipple Type: Slow - flow  LATCH Score Latch: Repeated attempts needed to sustain latch, nipple held in mouth throughout feeding, stimulation needed to elicit sucking reflex.  Audible Swallowing: A few with stimulation  Type of Nipple: Everted at rest and after stimulation  Comfort (Breast/Nipple): Soft / non-tender  Hold (Positioning): Assistance needed to correctly position infant at breast and maintain latch.  LATCH Score: 7   Interventions Interventions: Assisted with latch;Breast feeding basics reviewed;Education;LC Services brochure  Discharge    Consult Status Consult Status: Follow-up Date: 10/19/22 Follow-up type: In-patient    Vivianne Master [facility] 10/18/2022, 2:55 PM

## 2022-10-18 NOTE — Anesthesia Postprocedure Evaluation (Signed)
Anesthesia Post Note  Patient: Stacie Carney  Procedure(s) Performed: CESAREAN SECTION WITH BILATERAL TUBAL LIGATION     Patient location during evaluation: PACU Anesthesia Type: Spinal Level of consciousness: awake Pain management: pain level controlled Vital Signs Assessment: post-procedure vital signs reviewed and stable Respiratory status: spontaneous breathing, nonlabored ventilation and respiratory function stable Cardiovascular status: blood pressure returned to baseline and stable Postop Assessment: no apparent nausea or vomiting Anesthetic complications: no   No notable events documented.  Last Vitals:  Vitals:   10/18/22 1315 10/18/22 1415  BP: 106/62 105/60  Pulse: 84 74  Resp: 18 18  Temp: 37.4 C 37 C  SpO2:  91%    Last Pain:  Vitals:   10/18/22 1415  TempSrc:   PainSc: 0-No pain   Pain Goal:                   [name]

## 2022-10-19 LAB — CBC
HCT: 26.1 % — ABNORMAL LOW (ref 36.0–46.0)
Hemoglobin: 7.9 g/dL — ABNORMAL LOW (ref 12.0–15.0)
MCH: 24.8 pg — ABNORMAL LOW (ref 26.0–34.0)
MCHC: 30.3 g/dL (ref 30.0–36.0)
MCV: 82.1 fL (ref 80.0–100.0)
Platelets: 268 10*3/uL (ref 150–400)
RBC: 3.18 MIL/uL — ABNORMAL LOW (ref 3.87–5.11)
RDW: 17 % — ABNORMAL HIGH (ref 11.5–15.5)
WBC: 11.4 10*3/uL — ABNORMAL HIGH (ref 4.0–10.5)
nRBC: 0 % (ref 0.0–0.2)

## 2022-10-19 LAB — TYPE AND SCREEN
ABO/RH(D): A POS
Antibody Screen: NEGATIVE
Unit division: 0
Unit division: 0

## 2022-10-19 LAB — BPAM RBC
Blood Product Expiration Date: 202403032359
Blood Product Expiration Date: 202403042359
ISSUE DATE / TIME: 202402090811
ISSUE DATE / TIME: 202402152238
Unit Type and Rh: 6200
Unit Type and Rh: 6200

## 2022-10-19 LAB — CREATININE, SERUM
Creatinine, Ser: 0.53 mg/dL (ref 0.44–1.00)
GFR, Estimated: >60 mL/min (ref >60)

## 2022-10-19 LAB — BIRTH TISSUE RECOVERY COLLECTION (PLACENTA DONATION)

## 2022-10-19 MED ORDER — IRON SUCROSE 500 MG IVPB - SIMPLE MED
500.0000 mg | Freq: Once | INTRAVENOUS | Status: AC
  Administered 2022-10-19: 500 mg via INTRAVENOUS
  Filled 2022-10-19: qty 275

## 2022-10-19 MED ORDER — POLYSACCHARIDE IRON COMPLEX 150 MG PO CAPS
150.0000 mg | ORAL_CAPSULE | Freq: Every day | ORAL | Status: DC
  Administered 2022-10-19 – 2022-10-20 (×2): 150 mg via ORAL
  Filled 2022-10-19 (×2): qty 1

## 2022-10-19 NOTE — Lactation Note (Signed)
This note was copied from a baby's chart. Lactation Consultation Note  Patient Name: Stacie Carney [id number] Date: 10/19/2022 Reason for consult: Follow-up assessment Age:43 hours  Arabic interpreter used via video.  Baby recently was breastfed and was supplemented with formula. Baby sleeping.  Mother denies questions or concerns. Feed on demand with cues.  Goal 8-12+ times per day after first 24 hrs.  Place baby STS if not cueing.  Suggest calling for help with latching as needed.  Maternal Data Has patient been taught Hand Expression?: Yes  Feeding Mother's Current Feeding Choice: Breast Milk and Formula  Interventions Interventions: Education  Consult Status Consult Status: Follow-up Date: 10/20/22 Follow-up type: In-patient    Vivianne Master [facility] 10/19/2022, 11:42 AM

## 2022-10-19 NOTE — Progress Notes (Addendum)
Subjective: Postpartum Day 1: Cesarean Delivery Patient's sister at bedside providing translation of English to Arabic as per patient's preference. Patient reports tolerating PO and no problems voiding.  Patient is able to ambulate without any problems. She denies lightheadedness or dizziness.     Objective: Vital signs in last 24 hours: Temp:  [98.4 F (36.9 C)-99.4 F (37.4 C)] 98.4 F (36.9 C) (02/20 0238) Pulse Rate:  [71-84] 71 (02/20 0238) Resp:  [16-18] 18 (02/20 0238) BP: (94-108)/(48-62) 94/48 (02/20 0238) SpO2:  [90 %-98 %] 98 % (02/20 0238)  Physical Exam:  General: alert, cooperative, and no distress Lochia: appropriate Uterine Fundus: firm Incision: With pressure dressing.  DVT Evaluation: No evidence of DVT seen on physical exam. No significant calf/ankle edema.  Recent Labs    10/19/22 0431  HGB 7.9*  HCT 26.1*      Latest Ref Rng & Units 10/19/2022    4:31 AM 10/15/2022   10:57 AM 07/02/2020    1:28 PM  CBC  WBC 4.0 - 10.5 K/uL 11.4  8.2  4.5   Hemoglobin 12.0 - 15.0 g/dL 7.9  9.7  13.2   Hematocrit 36.0 - 46.0 % 26.1  32.0  39.2   Platelets 150 - 400 K/uL 268  313  260     Assessment/Plan: 42 y/o P5 POD # 2 after a repeat C/section and bilateral tubal ligation, s/p postpartum hemorrhage, with a current Hgb of 7.9. - Patient is able to ambulate without any problems.  She desires to proceed with IV iron infusion for anemia.  - Out of bed and ambulation.  - She may shower and remove pressure dressing later today.  Archie Endo, MD 10/19/2022, 11:07 AM

## 2022-10-19 NOTE — Progress Notes (Signed)
Dr. Mikey Bussing notified that patient's IV infiltrated and we attempted to restart the IV however it ended up not working. Patient did not want to be stuck again  for the rest of the iron to go in.

## 2022-10-19 NOTE — Progress Notes (Signed)
Family member let mom know the plan for today and to let me know if there are any complaints or concerns. Patient aware of Incentive Spirometer and to walk halls and to update feeds. Patient aware of meds that she can have. All questions answered.

## 2022-10-20 LAB — SURGICAL PATHOLOGY

## 2022-10-20 MED ORDER — IRON 325 (65 FE) MG PO TABS: 1.0000 | ORAL_TABLET | ORAL | 1 refills | Status: AC

## 2022-10-20 MED ORDER — SENNOSIDES-DOCUSATE SODIUM 8.6-50 MG PO TABS: 2.0000 | ORAL_TABLET | Freq: Every day | ORAL | 0 refills | Status: AC | PRN

## 2022-10-20 MED ORDER — ACETAMINOPHEN 500 MG PO TABS: 1000.0000 mg | ORAL_TABLET | Freq: Four times a day (QID) | ORAL | 1 refills | Status: AC | PRN

## 2022-10-20 MED ORDER — IBUPROFEN 600 MG PO TABS: 600.0000 mg | ORAL_TABLET | Freq: Four times a day (QID) | ORAL | 1 refills | Status: AC | PRN

## 2022-10-20 MED ORDER — PRENATAL MULTIVITAMIN PLUS DHA 27-0.8-250 MG PO CAPS: 1.0000 | ORAL_CAPSULE | Freq: Every day | ORAL | 3 refills | Status: AC

## 2022-10-20 MED ORDER — OXYCODONE HCL 5 MG PO TABS: 5.0000 mg | ORAL_TABLET | Freq: Four times a day (QID) | ORAL | 0 refills | Status: AC | PRN

## 2022-10-20 NOTE — Social Work (Signed)
CSW received consult for MOB needing a car seat and WIC information. MOB was informed of the price for the car seat. CSW provided MOB with a car seat and resources to schedule a [facility] appointment. CSW inquired if MOB had a place for the infant to sleep. MOB reported yes she has a crib.   CSW identifies no further need for intervention and no barriers to discharge at this time.  Stacie Carney, [place] Social Worker [phone]

## 2022-10-20 NOTE — Discharge Summary (Signed)
Postpartum Discharge Summary  Date of Service updated 10-20-22     Patient Name: Stacie Carney DOB: 09-26-1980 MRN: [id number]  Date of admission: 10/18/2022 Delivery date:10/18/2022  Delivering provider: Bing Matter D  Date of discharge: 10/20/2022  Admitting diagnosis: History of cesarean section [Z98.891] Desires sterilization Intrauterine pregnancy: [redacted]w[redacted]d    Secondary diagnosis:  Principal Problem:   History of cesarean section Active Problems:   Postpartum care following cesarean delivery S/p bilateral salpingectomy for sterilization  Additional problems: anemia asymptomatic    Discharge diagnosis: Term Pregnancy Delivered                                              Post partum procedures: IV iron   Hospital course: Sceduled C/S   42y.o. yo G5P4105 at 323w6das admitted to the hospital 10/18/2022 for scheduled cesarean section with the following indication:Elective Repeat and bilateral salpingectomy d/t desiring permanent sterilization. Delivery details are as follows:  Membrane Rupture Time/Date: 8:18 AM ,10/18/2022   Delivery Method:C-Section, Low Transverse  Details of operation can be found in separate operative note.  Patient had a postpartum course complicated by asymptomatic anemia.  She is ambulating, tolerating a regular diet, passing flatus, and urinating well. Patient is discharged home in stable condition on  10/20/22        Newborn Data: Birth date:10/18/2022  Birth time:8:19 AM  Gender:Female  Living status:Living  Apgars:4 ,8  Weight:3480 g     Physical exam  Vitals:   10/19/22 0238 10/19/22 1533 10/19/22 2315 10/20/22 0603  BP: (!) 94/48 (!) 92/43 105/72 (!) 106/59  Pulse: 71 92 98 83  Resp: 18 18 19 18  $ Temp: 98.4 F (36.9 C) 98.3 F (36.8 C) 98.4 F (36.9 C)   TempSrc: Oral  Oral   SpO2: 98%  100% 100%  Weight:      Height:       General: alert and no distress Lochia: appropriate Uterine Fundus: FF, NT Incision: honeycomb dressing  c/d/i DVT Evaluation: no calf tenderness  Labs: Lab Results  Component Value Date   WBC 11.4 (H) 10/19/2022   HGB 7.9 (L) 10/19/2022   HCT 26.1 (L) 10/19/2022   MCV 82.1 10/19/2022   PLT 268 10/19/2022      Latest Ref Rng & Units 10/19/2022    4:31 AM  CMP  Creatinine 0.44 - 1.00 mg/dL 0.53    Edinburgh Score:    10/18/2022   11:03 AM  Edinburgh Postnatal Depression Scale Screening Tool  I have been able to laugh and see the funny side of things. 0  I have looked forward with enjoyment to things. 0  I have blamed myself unnecessarily when things went wrong. 2  I have been anxious or worried for no good reason. 0  I have felt scared or panicky for no good reason. 0  Things have been getting on top of me. 2  I have been so unhappy that I have had difficulty sleeping. 0  I have felt sad or miserable. 0  I have been so unhappy that I have been crying. 0  The thought of harming myself has occurred to me. 0  Edinburgh Postnatal Depression Scale Total 4      After visit meds:  Allergies as of 10/20/2022   No Known Allergies      Medication List  TAKE these medications    acetaminophen 500 MG tablet Commonly known as: TYLENOL Take 2 tablets (1,000 mg total) by mouth every 6 (six) hours as needed for mild pain or moderate pain.   ibuprofen 600 MG tablet Commonly known as: ADVIL Take 1 tablet (600 mg total) by mouth every 6 (six) hours as needed.   Iron 325 (65 Fe) MG Tabs Take 1 tablet (325 mg total) by mouth every other day.   oxyCODONE 5 MG immediate release tablet Commonly known as: Oxy IR/ROXICODONE Take 1 tablet (5 mg total) by mouth every 6 (six) hours as needed for moderate pain, severe pain or breakthrough pain.   Prenatal Multivitamin Plus DHA 27-0.8-250 MG Caps Take 1 tablet by mouth daily.   senna-docusate 8.6-50 MG tablet Commonly known as: Senokot-S Take 2 tablets by mouth daily as needed for mild constipation or moderate constipation.    Vitamin D3 50 MCG (2000 UT) Caps Generic drug: Cholecalciferol Take 2,000 Units by mouth daily.         Discharge home in stable condition Infant Feeding: Bottle and Breast Infant Disposition:home with mother Discharge instruction: per After Visit Summary and Postpartum booklet. Activity: Advance as tolerated. Pelvic rest for 6 weeks.  Diet: routine diet Anticipated Birth Control:  Bilateral Salpingectomy with C/S Postpartum Appointment: Schedule in 1-2 wks and in 6wks Additional Postpartum F/U:  f/u anemia Future Appointments:No future appointments. Follow up Visit:  Follow-up Information     Josefine Class, MD Follow up.   Specialty: Obstetrics and Gynecology Contact information: [address] [place] [address] [phone]         Josefine Class, MD. Schedule an appointment as soon as possible for a visit in 1 week(s).   Specialty: Obstetrics and Gynecology Why: For Postpartum follow-up.  Call and schedule f/u appointment to be seen in 1-2wks. Contact information: [address] [place] [address] [phone]         Josefine Class, MD. Schedule an appointment as soon as possible for a visit in 6 week(s).   Specialty: Obstetrics and Gynecology Why: For Postpartum follow-up Contact information: [address], [place] [address] [place] [address] [phone]                     10/20/2022 Delice Lesch, MD

## 2022-10-28 ENCOUNTER — Telehealth (HOSPITAL_COMMUNITY): Payer: Self-pay | Admitting: *Deleted

## 2022-10-28 NOTE — Telephone Encounter (Signed)
Attempted Hospital Discharge Follow-Up Call with help of interpreter "[phone]". Interpreter left voice mail requesting that patient return RN's phone call if patient has any concerns or questions.

## 2022-11-02 ENCOUNTER — Ambulatory Visit (HOSPITAL_COMMUNITY)
Admission: EM | Admit: 2022-11-02 | Discharge: 2022-11-02 | Disposition: A | Discharge status: Home / Self Care | Payer: Medicaid Other | Attending: Family Medicine | Admitting: Family Medicine

## 2022-11-02 ENCOUNTER — Encounter (HOSPITAL_COMMUNITY): Payer: Self-pay | Admitting: Emergency Medicine

## 2022-11-02 ENCOUNTER — Other Ambulatory Visit: Payer: Self-pay

## 2022-11-02 ENCOUNTER — Telehealth (HOSPITAL_COMMUNITY): Payer: Self-pay | Admitting: Emergency Medicine

## 2022-11-02 DIAGNOSIS — K047 Periapical abscess without sinus: Secondary | ICD-10-CM

## 2022-11-02 MED ORDER — IBUPROFEN 800 MG PO TABS: 800.0000 mg | ORAL_TABLET | Freq: Three times a day (TID) | ORAL | 0 refills | Status: DC | PRN

## 2022-11-02 MED ORDER — IBUPROFEN 800 MG PO TABS: 800.0000 mg | ORAL_TABLET | Freq: Three times a day (TID) | ORAL | 0 refills | Status: AC | PRN

## 2022-11-02 MED ORDER — AMOXICILLIN-POT CLAVULANATE 875-125 MG PO TABS: 1.0000 | ORAL_TABLET | Freq: Two times a day (BID) | ORAL | 0 refills | Status: AC

## 2022-11-02 MED ORDER — AMOXICILLIN-POT CLAVULANATE 875-125 MG PO TABS: 1.0000 | ORAL_TABLET | Freq: Two times a day (BID) | ORAL | 0 refills | Status: DC

## 2022-11-02 NOTE — ED Triage Notes (Addendum)
Pain started 2 days ago.  Pain is left jaw, top and bottom.    Has taken ibuprofen  Patient is breast feeding -child born 10/18/2022

## 2022-11-02 NOTE — ED Notes (Signed)
Changed pharmacy to [facility] on [address] at patient's request

## 2022-11-02 NOTE — Discharge Instructions (Signed)
Take amoxicillin-clavulanate 875 mg--1 tab twice daily with food for 7 days  Take ibuprofen 800 mg--1 tab every 8 hours as needed for pain.  Both of these medications are safe while you are nursing.

## 2022-11-02 NOTE — ED Provider Notes (Addendum)
[facility]    CSN: [id number] Arrival date & time: 11/02/22  1806      History   Chief Complaint Chief Complaint  Patient presents with   Dental Pain    HPI Stacie Carney is a 42 y.o. female.    Dental Pain  Her pain on the left side of her mouth.  It hurts to chew.  It began bothering her about 2 days ago.  She delivered a baby in the middle of February and she is nursing.  No known drug allergies  History reviewed. No pertinent past medical history.  There are no problems to display for this patient.   Past Surgical History:  Procedure Laterality Date   CESAREAN SECTION      OB History   No obstetric history on file.      Home Medications    Prior to Admission medications   Medication Sig Start Date End Date Taking? Authorizing Provider  amoxicillin-clavulanate (AUGMENTIN) 875-125 MG tablet Take 1 tablet by mouth 2 (two) times daily for 7 days. 11/02/22 11/09/22 Yes BanisterGwenlyn Perking, MD  ferrous sulfate 325 (65 FE) MG tablet Take 325 mg by mouth daily. 09/03/22  Yes [provider]  ibuprofen (ADVIL) 800 MG tablet Take 1 tablet (800 mg total) by mouth every 8 (eight) hours as needed (pain). 11/02/22  Yes Barrett Henle, MD    Family History History reviewed. No pertinent family history.  Social History Social History   Tobacco Use   Smoking status: Never   Smokeless tobacco: Never  Vaping Use   Vaping Use: Never used  Substance Use Topics   Alcohol use: Never   Drug use: Never     Allergies   Patient has no known allergies.   Review of Systems Review of Systems   Physical Exam Triage Vital Signs ED Triage Vitals  Enc Vitals Group     BP 11/02/22 1843 132/84     Pulse Rate 11/02/22 1843 94     Resp 11/02/22 1843 (!) 22     Temp 11/02/22 1843 98.9 F (37.2 C)     Temp Source 11/02/22 1843 Oral     SpO2 11/02/22 1843 95 %     Weight --      Height --      Head Circumference --      Peak Flow --      Pain  Score 11/02/22 1838 10     Pain Loc --      Pain Edu? --      Excl. in [place]? --    No data found.  Updated Vital Signs BP 132/84 (BP Location: Right Arm)   Pulse 94   Temp 98.9 F (37.2 C) (Oral)   Resp (!) 22   SpO2 95%   Visual Acuity Right Eye Distance:   Left Eye Distance:   Bilateral Distance:    Right Eye Near:   Left Eye Near:    Bilateral Near:     Physical Exam Vitals reviewed.  Constitutional:      General: She is not in acute distress.    Appearance: She is not ill-appearing, toxic-appearing or diaphoretic.  HENT:     Mouth/Throat:     Mouth: Mucous membranes are moist.  Eyes:     Extraocular Movements: Extraocular movements intact.     Conjunctiva/sclera: Conjunctivae normal.     Pupils: Pupils are equal, round, and reactive to light.  Cardiovascular:     Rate and  Rhythm: Normal rate and regular rhythm.  Skin:    Coloration: Skin is not pale.  Neurological:     Mental Status: She is alert and oriented to person, place, and time.  Psychiatric:        Behavior: Behavior normal.      UC Treatments / Results  Labs (all labs ordered are listed, but only abnormal results are displayed) Labs Reviewed - No data to display  EKG   Radiology No results found.  Procedures Procedures (including critical care time)  Medications Ordered in UC Medications - No data to display  Initial Impression / Assessment and Plan / UC Course  I have reviewed the triage vital signs and the nursing notes.  Pertinent labs & imaging results that were available during my care of the patient were reviewed by me and considered in my medical decision making (see chart for details).          Augmentin and ibuprofen are sent in to treat the infection and pain.  These are both safe while she is nursing.  She is given a list of low-cost dental providers. Final Clinical Impressions(s) / UC Diagnoses   Final diagnoses:  Dental infection     Discharge Instructions       Take amoxicillin-clavulanate 875 mg--1 tab twice daily with food for 7 days  Take ibuprofen 800 mg--1 tab every 8 hours as needed for pain.  Both of these medications are safe while you are nursing.     ED Prescriptions     Medication Sig Dispense Auth. Provider   amoxicillin-clavulanate (AUGMENTIN) 875-125 MG tablet Take 1 tablet by mouth 2 (two) times daily for 7 days. 14 tablet Geniva Lohnes, Gwenlyn Perking, MD   ibuprofen (ADVIL) 800 MG tablet Take 1 tablet (800 mg total) by mouth every 8 (eight) hours as needed (pain). 21 tablet Massai Hankerson, Gwenlyn Perking, MD      PDMP not reviewed this encounter.   Barrett Henle, MD 11/02/22 1901    Barrett Henle, MD 11/02/22 1901

## 2022-11-03 ENCOUNTER — Encounter (HOSPITAL_COMMUNITY): Payer: Self-pay | Admitting: Emergency Medicine

## 2022-11-15 IMAGING — DX DG ABDOMEN 2V
2 series · 2 of 2 positions shown · non-contrast
Comparison: None.

CLINICAL DATA: Lower abdominal pain and swelling about the
umbilicus for 3 months.

EXAM:
ABDOMEN - 2 VIEW

[abdomen erect]
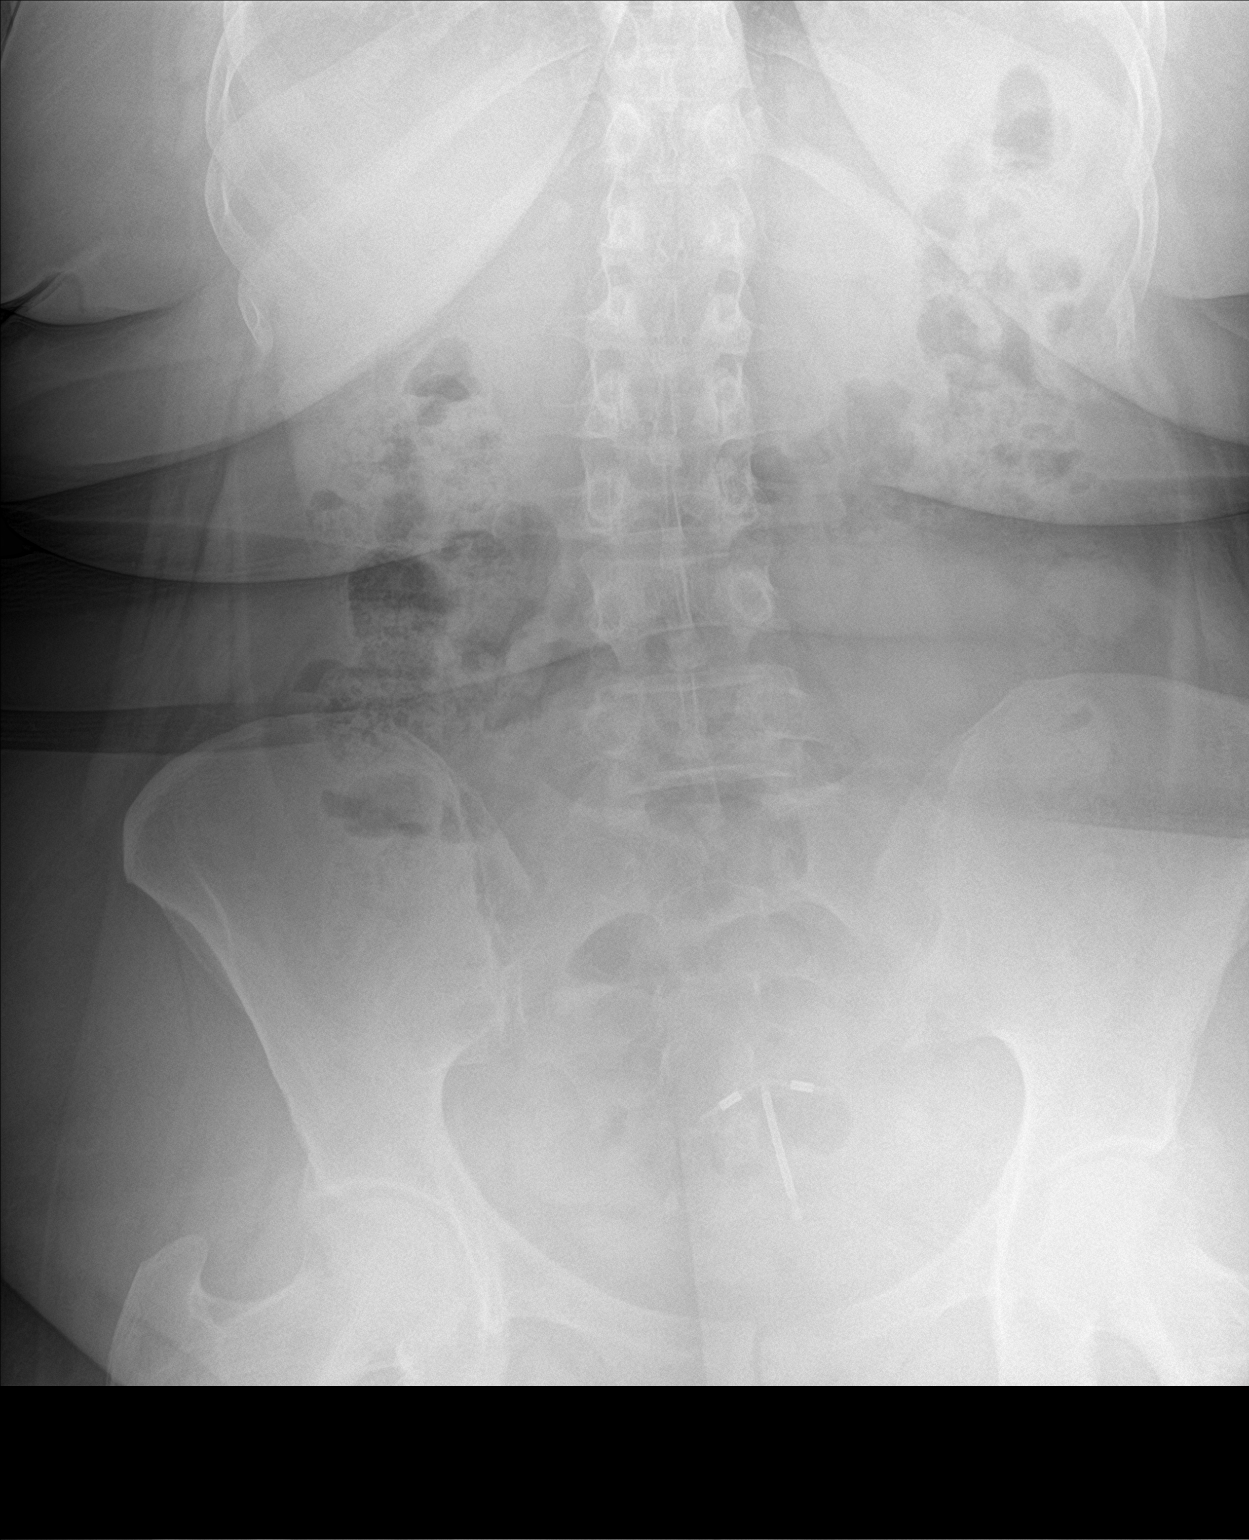

[abdomen supine]
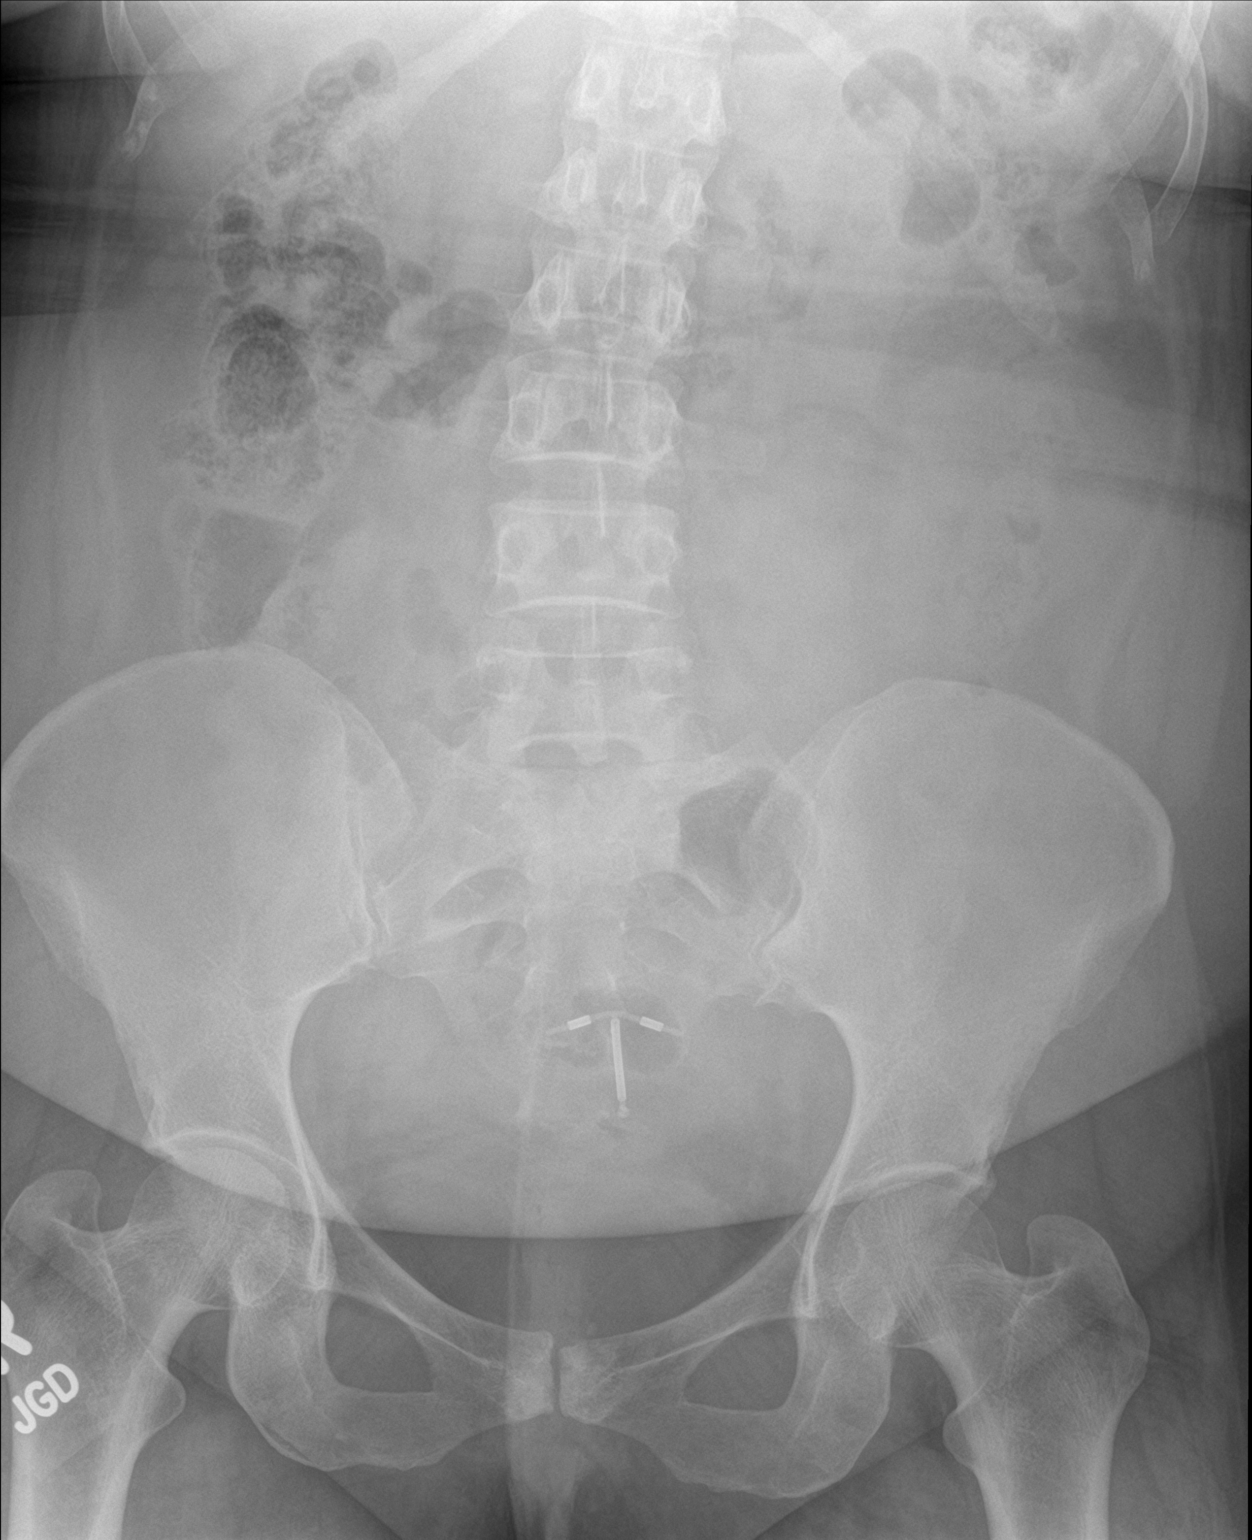

[2 of 2 positions shown; findings below may reference images not displayed]

FINDINGS: The bowel gas pattern is normal. There is no evidence of free air.
No radio-opaque calculi or other significant radiographic
abnormality is seen. IUD noted.
IMPRESSION: Negative exam.

## 2022-11-26 ENCOUNTER — Telehealth (HOSPITAL_COMMUNITY): Payer: Self-pay

## 2022-11-26 NOTE — Telephone Encounter (Signed)
Chart review.
